# Patient Record
Sex: Male | Born: 1960 | Race: White | Hispanic: No | Marital: Single | Smoking: Never smoker
Health system: Southern US, Community
[De-identification: ages and names within clinical notes are randomized; demographics above are authoritative.]

## PROBLEM LIST (undated history)

## (undated) DIAGNOSIS — R569 Unspecified convulsions: Secondary | ICD-10-CM

## (undated) HISTORY — PX: HERNIA REPAIR: SHX51

---
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
## 2001-11-13 ENCOUNTER — Ambulatory Visit (HOSPITAL_BASED_OUTPATIENT_CLINIC_OR_DEPARTMENT_OTHER): Admission: RE | Admit: 2001-11-13 | Discharge: 2001-11-13 | Payer: Self-pay | Admitting: General Surgery

## 2003-03-05 ENCOUNTER — Encounter: Admission: RE | Admit: 2003-03-05 | Discharge: 2003-03-05 | Payer: Self-pay | Admitting: Neurology

## 2004-01-27 ENCOUNTER — Emergency Department (HOSPITAL_COMMUNITY): Admission: EM | Admit: 2004-01-27 | Discharge: 2004-01-28 | Payer: Self-pay | Admitting: Emergency Medicine

## 2004-02-03 ENCOUNTER — Ambulatory Visit: Payer: Self-pay | Admitting: Family Medicine

## 2005-01-03 ENCOUNTER — Ambulatory Visit: Payer: Self-pay | Admitting: Internal Medicine

## 2005-07-11 IMAGING — CR DG FOOT COMPLETE 3+V*L*
3 series · 3 of 3 positions shown · non-contrast
Comparison: None.

CLINICAL DATA: Fall.  Lateral foot pain.  
 THREE VIEW LEFT FOOT, 01/27/04:

[view not recorded (1 of 3)]
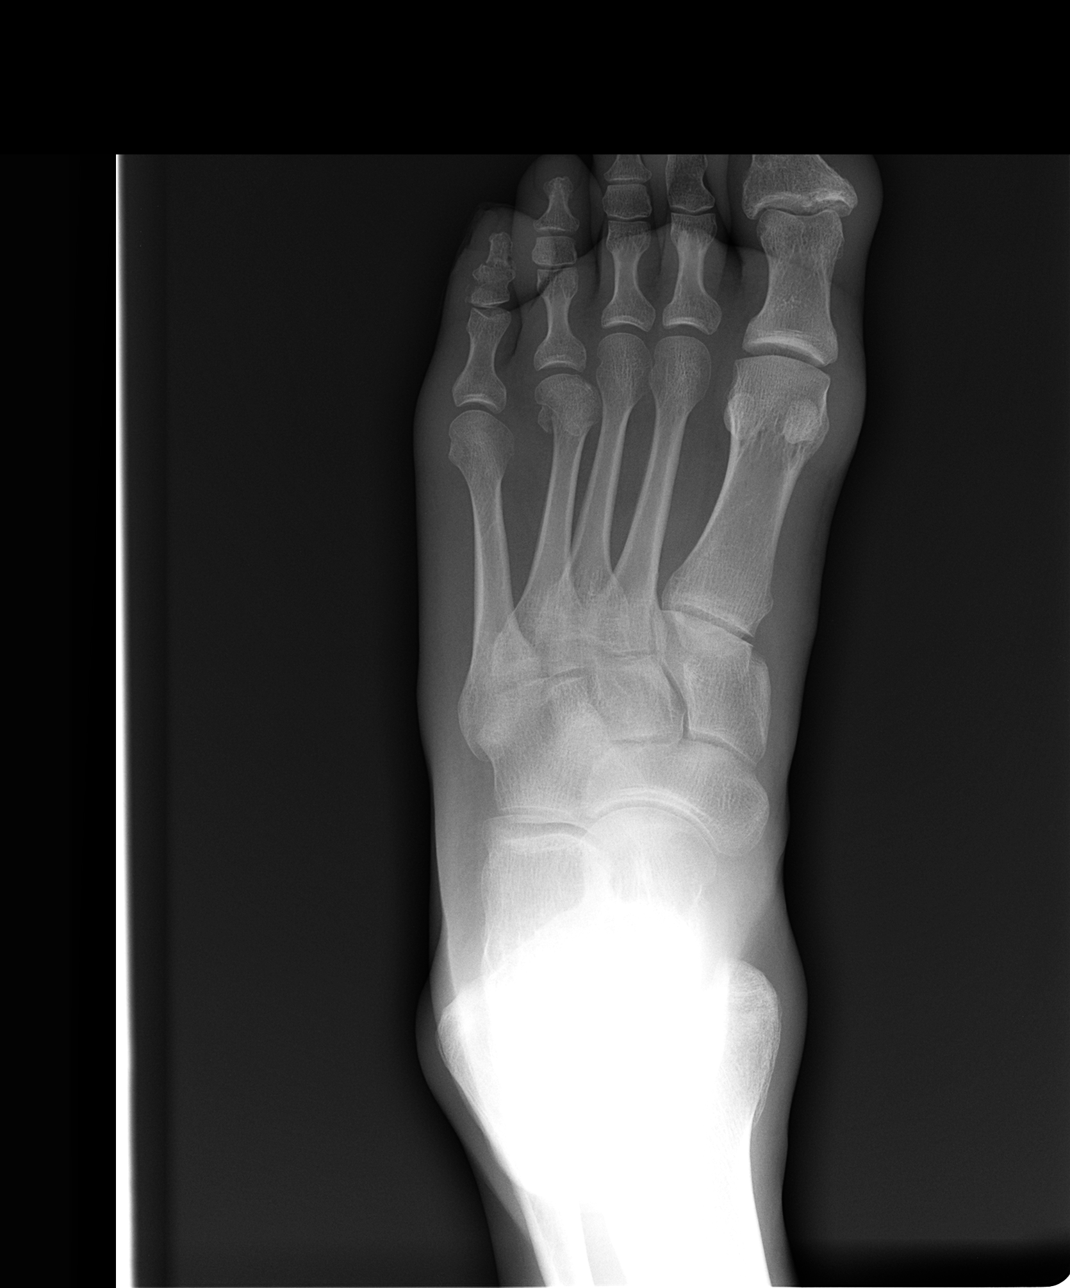

[view not recorded (2 of 3)]
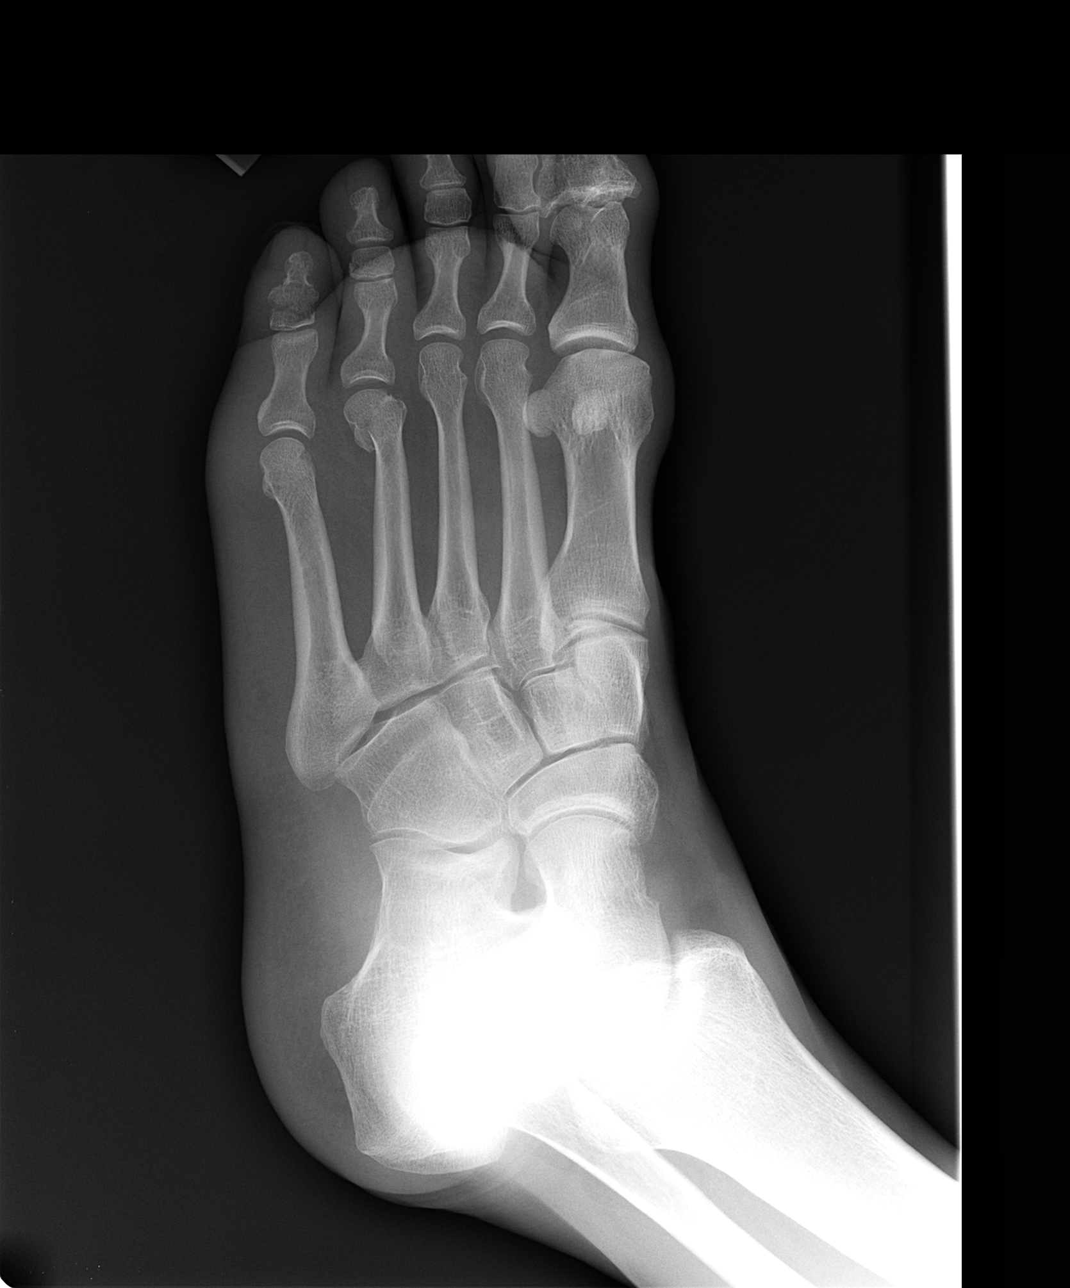

[view not recorded (3 of 3)]
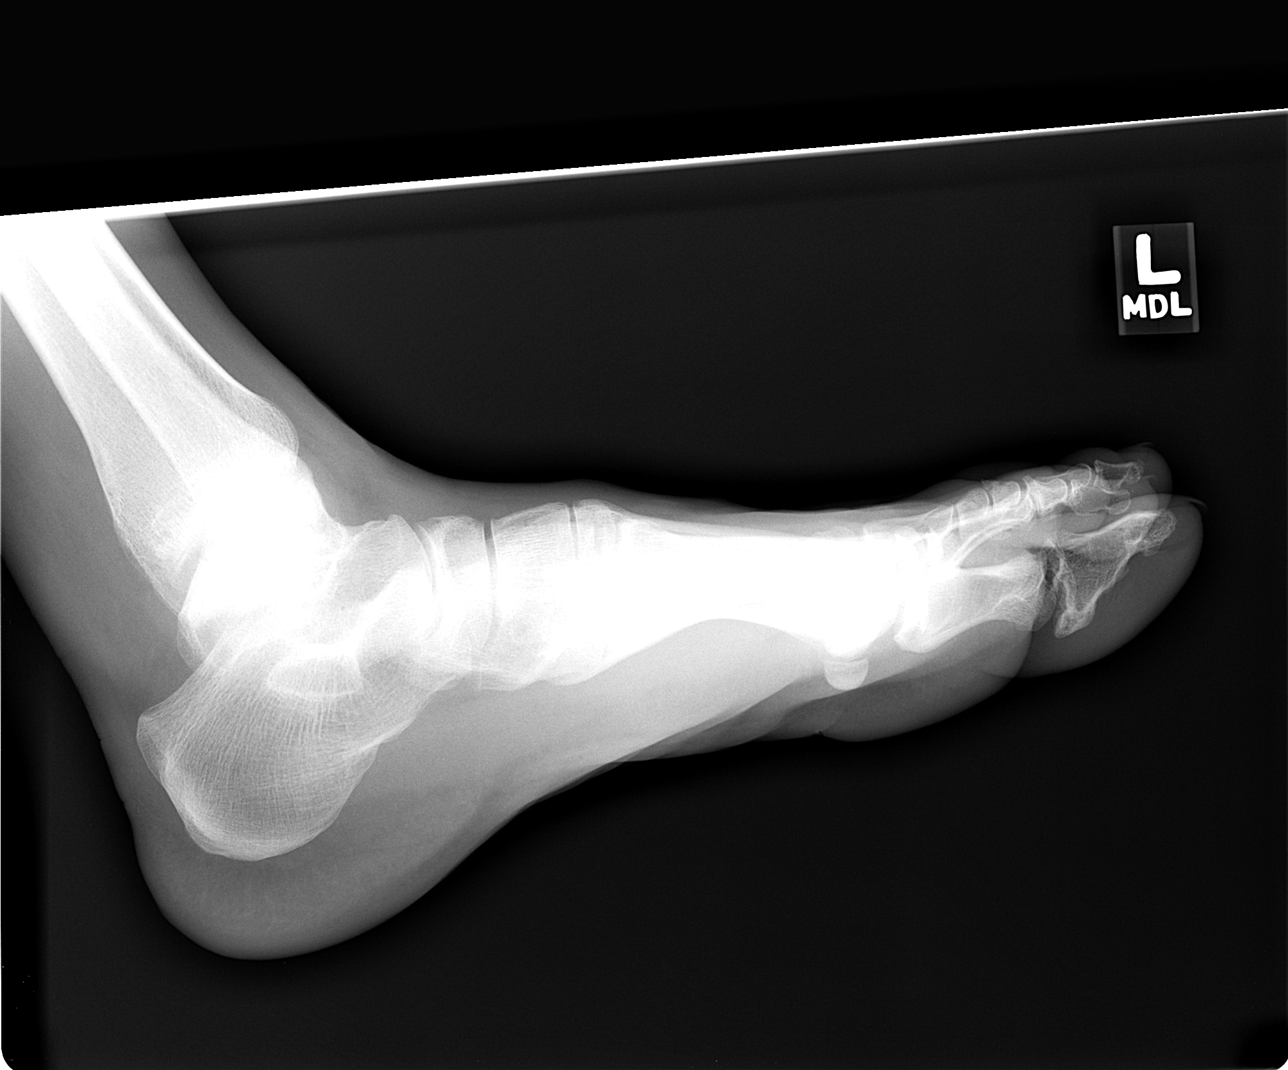

[3 of 3 positions shown; findings below may reference images not displayed]

Three view exam of the left foot shows a comminuted fracture involving the distal 4th metatarsal.  Fracture of the middle and distal phalanx of the little toe noted as well.  There may be an associated fracture of the middle phalanx of the 4th toe.  Degenerative changes are seen at the IP joint of the great toe. 
 Lateral projection does not demonstrate the articulation between the talus and the calcaneus well raising the question of talocalcaneal coalition although there is no evidence for talar beaking to support this finding.
IMPRESSION: Acute fractures of the 4th metatarsal and phalanges of the little toe.  There may also be a small fracture fragment associated with the middle phalanx of the 4th toe.

## 2005-07-11 IMAGING — CT CT HEAD W/O CM
2 of 3 series · 14 of 33 positions shown, 18 images · non-contrast
Comparison: None.

CLINICAL DATA: Fall with head injury.
 HEAD CT WITHOUT CONTRAST, 01/27/04:

[Series 2: — · sagittal · 0.61mm/px · 1 of 3 slices shown (1 of 2)]
[im 2/3  brain]
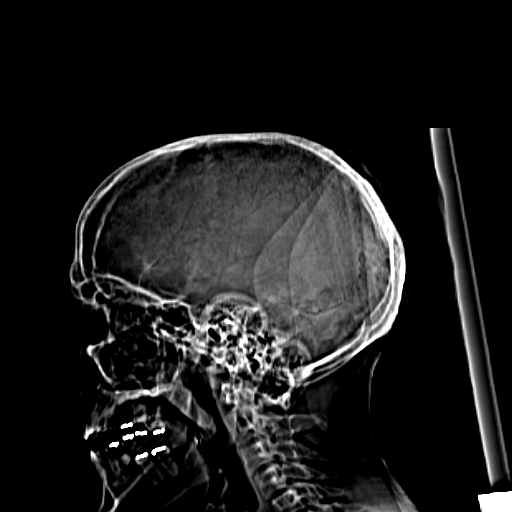

[Series 3: — · axial · 0.43mm/px · z∈[+1239,+1364]mm · 13 of 31 slices shown, 17 images (2 of 2)]
[im 3/31  brain]
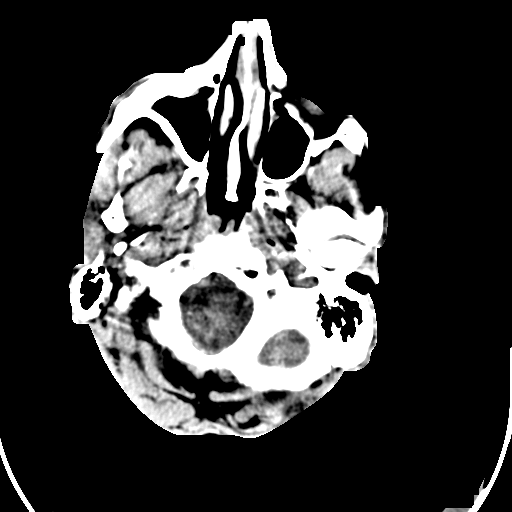
[im 3/31  bone]
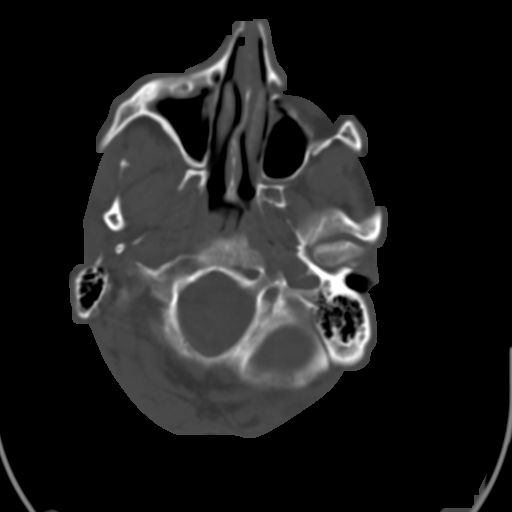
[im 5/31  brain]
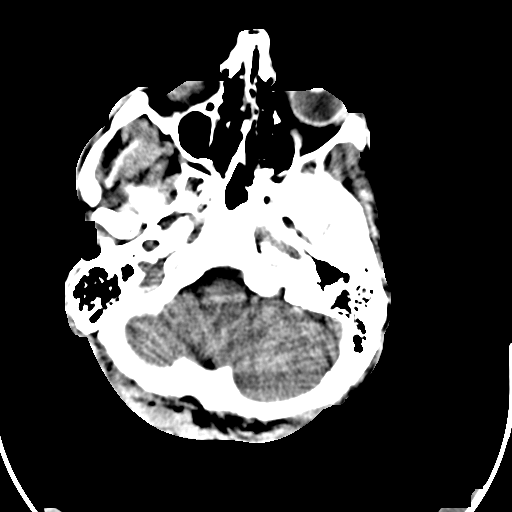
[im 7/31  brain]
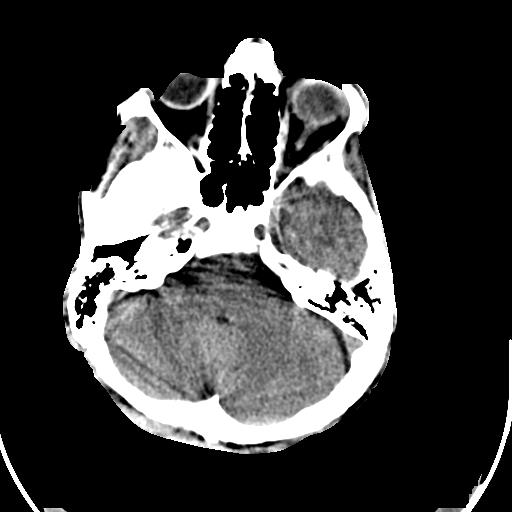
[im 9/31  brain]
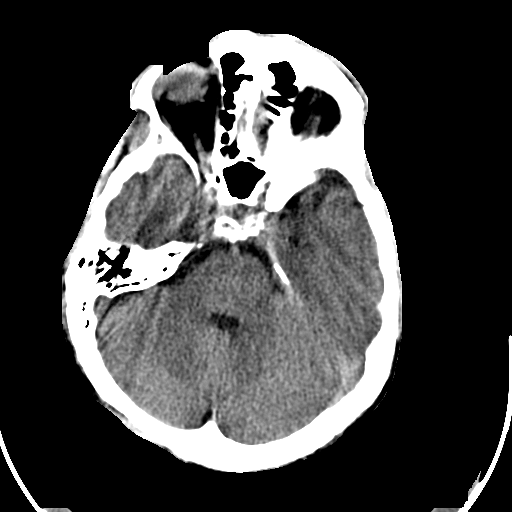
[im 11/31  brain]
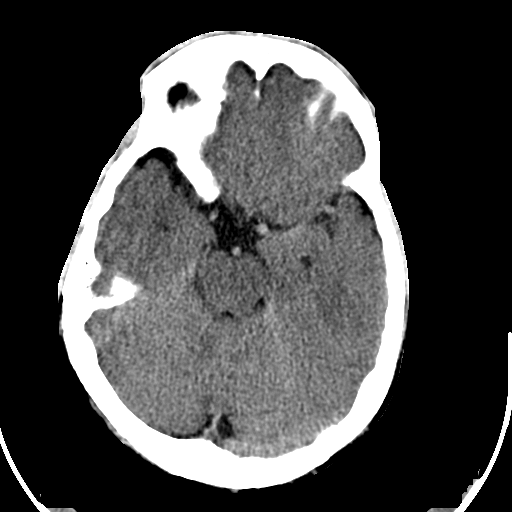
[im 11/31  bone]
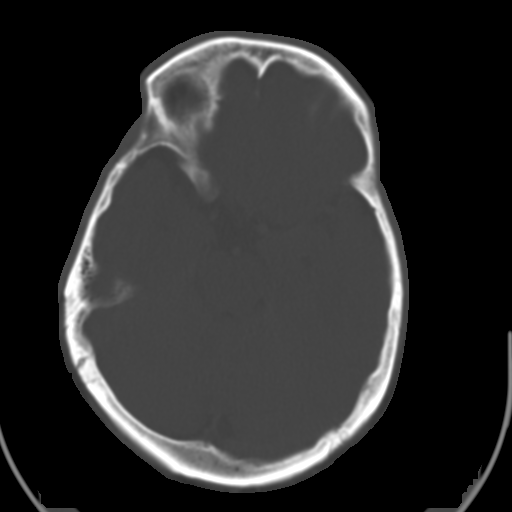
[im 13/31  brain]
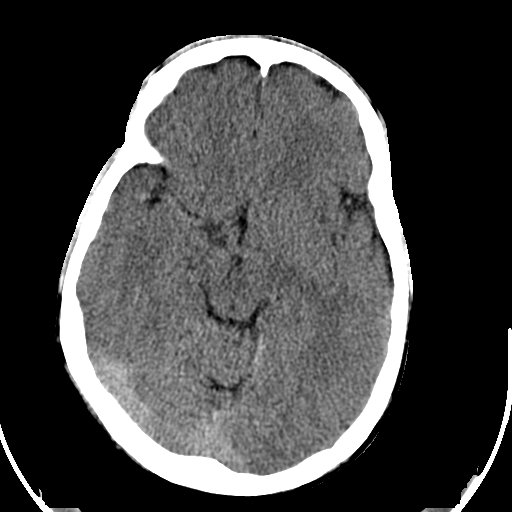
[im 16/31  brain]
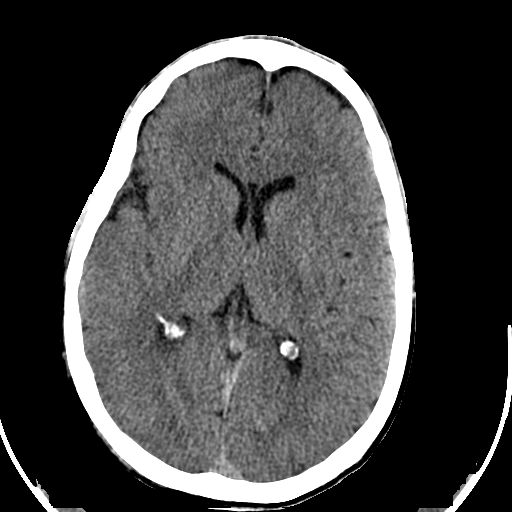
[im 18/31  brain]
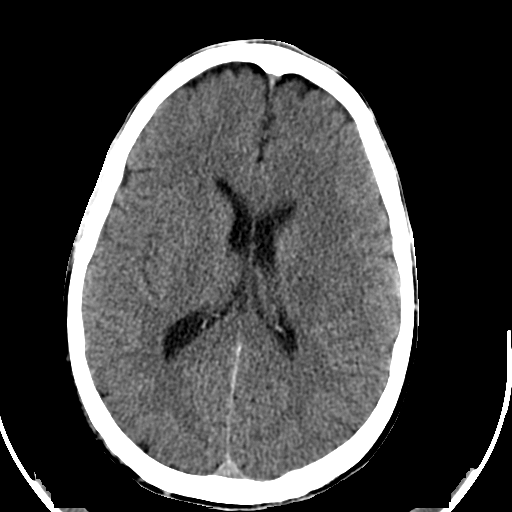
[im 20/31  brain]
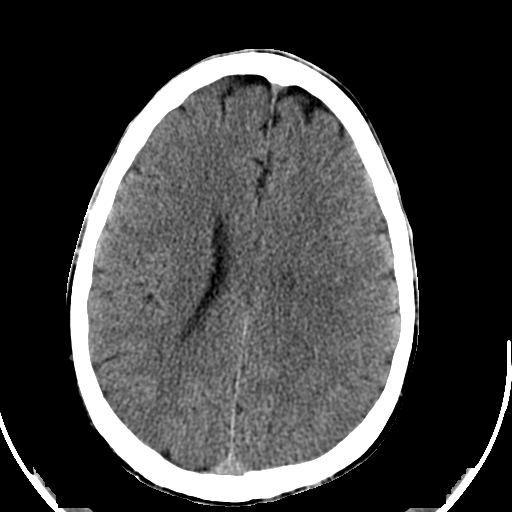
[im 20/31  bone]
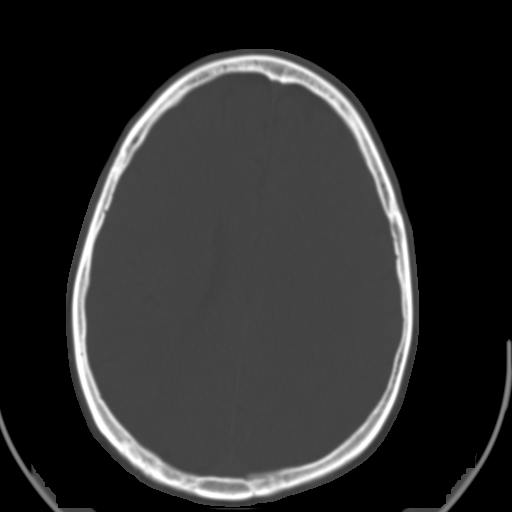
[im 22/31  brain]
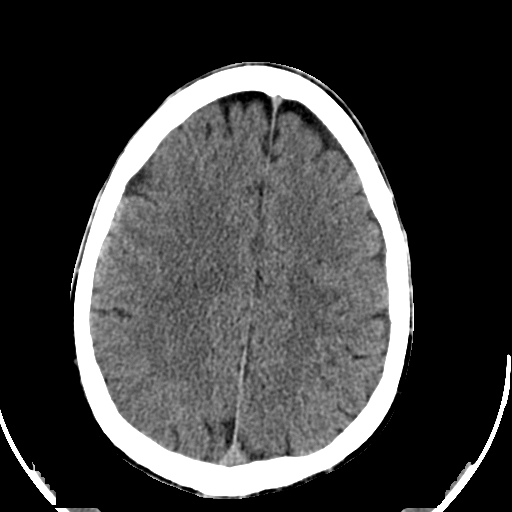
[im 24/31  brain]
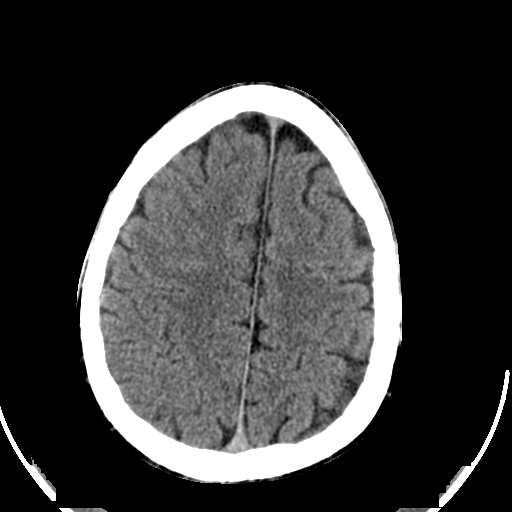
[im 26/31  brain]
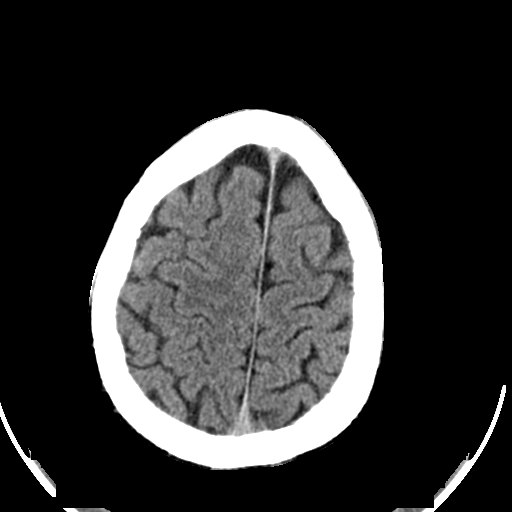
[im 28/31  brain]
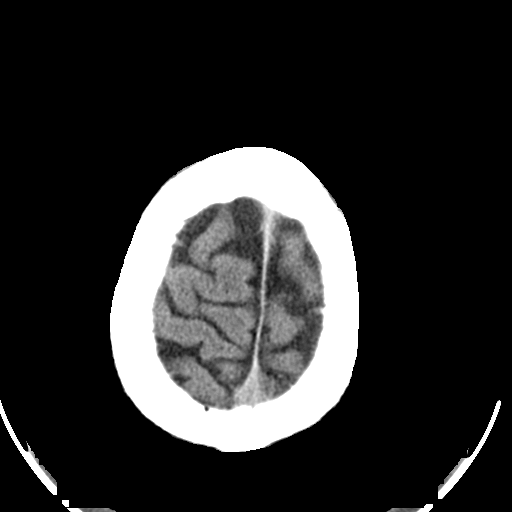
[im 28/31  bone]
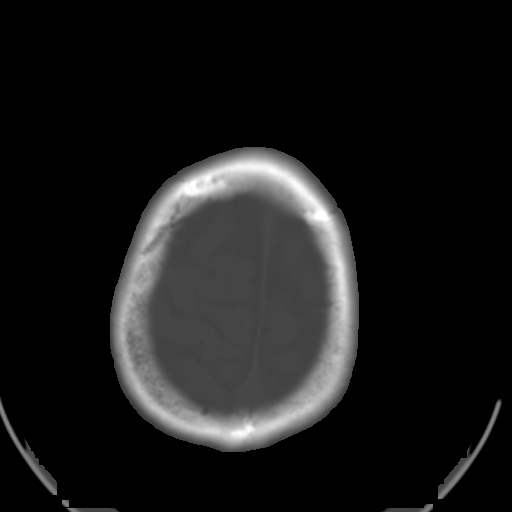

[14 of 33 positions shown; findings below may reference images not displayed]

FINDINGS: There is no evidence for acute hemorrhage, hydrocephalus, mass effect, or abnormal extraaxial fluid collection.  Fracture of the right nasal bone noted.  There is mucosal thickening in the right maxillary sinus with an air-fluid level, which may be related to a component of hemorrhage.  Focal soft tissue thickening over the frontal scalp suggests contusion.
IMPRESSION: 1.  No acute intracranial abnormality.  
 2.  Nasal bone fracture.

## 2009-02-23 ENCOUNTER — Encounter: Payer: Self-pay | Admitting: Internal Medicine

## 2009-03-16 ENCOUNTER — Encounter: Admission: RE | Admit: 2009-03-16 | Discharge: 2009-03-16 | Payer: Self-pay | Admitting: Neurology

## 2010-03-18 ENCOUNTER — Encounter: Payer: Self-pay | Admitting: Internal Medicine

## 2010-04-22 NOTE — Consult Note (Signed)
Summary: [facility]  [facility]   Imported By: Lanelle Bal 03/30/2010 08:14:01  _____________________________________________________________________  External Attachment:    Type:   Image     Comment:   External Document

## 2010-08-06 NOTE — Op Note (Signed)
NAME:  SEMISI, [name]                      ACCOUNT NO.:  [id number]   MEDICAL RECORD NO.:  [id number]                   PATIENT TYPE:  AMB   LOCATION:  [facility]                                  FACILITY:  [facility]   PHYSICIAN:  Anselm Pancoast. Zachery Dakins, M.D.          DATE OF BIRTH:  November 03, 1960   DATE OF PROCEDURE:  11/13/2001  DATE OF DISCHARGE:                                 OPERATIVE REPORT   PREOPERATIVE DIAGNOSIS:  Left inguinal hernia, probably indirect.   POSTOPERATIVE DIAGNOSIS:  Indirect left inguinal hernia.   PROCEDURE:  Left inguinal herniorrhaphy with mesh reinforcement.   ANESTHESIA:  MAC with local.   SURGEON:  Anselm Pancoast. Zachery Dakins, M.D.   ASSISTANT:  Nurse.   HISTORY:  The patient is a 50 year old Caucasian male who I first saw  approximately four weeks ago when he had developed pain after return home  from work one day and stated he had had a pulling sensation while he was  moving some heavy objects at work.  This was reported to his supervisor and  he was referred for Workers Comp evaluation and when I first saw him at the  office, he is a thin, lanky young male who had had a previous herniorrhaphy  when he was a child on the right, and I could not feel a definite bulge on  physical exam.  I thought from the history it sounds as if he did have a  fresh left inguinal hernia, and I recommended that I re-examine it  approximately two weeks later and when I did, I still could not feel a  definite bulge but his mother stated that she had actually seen the bulge  and he certainly gave a history of intermittent bulge and pain in the left  groin, pain that radiated to his testicle.  Since then it has increased in  size, so it is obviously an indirect hernia that reduces spontaneously, and  has been approved by Workers Comp.  I had recommended that we repair it as  an outpatient with mesh reinforcement, and he is here for the planned  procedure today.   DESCRIPTION OF  PROCEDURE:  The patient was given 1 g of Kefzol, sedation  administered, and then the left pubic area was clipped and then prepped with  Betadine surgical solution and draped in a sterile manner.  The inguinal  incision area was infiltrated with a mixture of 0.5% plain Xylocaine and  0.25% Marcaine with adrenalin, and the ilioinguinal nerve areas infiltrated  with a blunted 21-gauge needle, all total about 25 cc of solution used.  Sharp dissection through the skin, subcutaneous, superficial veins x2  clamped, divided, and ligated with fine Vicryl, and then the external  oblique was opened.  Then the external oblique was opened through the  external ring.  The ilioinguinal nerve was protected, elevated with the cord  structures, and you could see an obvious indirect hernia sac protruding  through the internal ring.  This was separated from the cord structures.  The sac was opened, and it was fairly well-formed, and a high sac ligation  was performed using a 2-0 silk suture.  There was a little area of the  attachment of the appendices of the sigmoid colon that was within the hernia  sac, and this was separated and ligated with fine Vicryl and placed back in  the preperitoneal cavity prior to the sac ligation under direct vision.  It  retracted nicely, and then the internal ring was tightened up with a few  interrupted sutures of 2-0 silk.  Next a piece of Prolene mesh shaped like a  sail, slit laterally, was placed, reinforcing the floor starting at the  symphysis pubis and sutured to the shelving edge of the inguinal ligament  inferiorly with a running 2-0 Prolene and the two tails sutured together  laterally, recreating and reinforcing the internal ring.  The superior flap  was sutured with interrupted 2-0 Prolene, and it was lying flat under  minimal tension.  The external oblique was then closed with a 3-0 Vicryl,  Scarpa's fascia closed with interrupted sutures of 5-0 Vicryl, and then a  5-  0 Vicryl subcuticular and Benzoin and steri-strips on the skin.  The patient  was awake at completion of surgery, minimal discomfort, and will be released  after a short stay.  I will see him in the office in approximately a week.  He should be able to return to work in probably about three weeks according  to the nature of his work and the amount of pain postoperatively.                                               Anselm Pancoast. Zachery Dakins, M.D.    WJW/MEDQ  D:  11/13/2001  T:  11/16/2001  Job:  [id number]   cc:   Dr. Vivia Ewing

## 2011-03-18 ENCOUNTER — Other Ambulatory Visit: Payer: Self-pay | Admitting: Neurology

## 2011-03-18 DIAGNOSIS — R569 Unspecified convulsions: Secondary | ICD-10-CM

## 2011-04-26 ENCOUNTER — Ambulatory Visit
Admission: RE | Admit: 2011-04-26 | Discharge: 2011-04-26 | Disposition: A | Discharge status: Home / Self Care | Payer: Self-pay | Source: Ambulatory Visit | Attending: Neurology | Admitting: Neurology

## 2011-04-26 DIAGNOSIS — R569 Unspecified convulsions: Secondary | ICD-10-CM

## 2012-11-21 ENCOUNTER — Telehealth: Payer: Self-pay | Admitting: Neurology

## 2012-11-22 NOTE — Telephone Encounter (Signed)
Reassigned to Dr. Marjory Lies.  Sent to scheduling.

## 2013-02-13 ENCOUNTER — Telehealth: Payer: Self-pay | Admitting: Diagnostic Neuroimaging

## 2013-02-13 MED ORDER — CARBAMAZEPINE 200 MG PO TABS: 400.0000 mg | ORAL_TABLET | Freq: Two times a day (BID) | ORAL | Status: DC

## 2013-02-13 NOTE — Telephone Encounter (Signed)
Former [facility] patient assigned to Dr Marjory Lies.  Rx has been sent.

## 2013-02-18 ENCOUNTER — Telehealth: Payer: Self-pay | Admitting: Diagnostic Neuroimaging

## 2013-02-18 NOTE — Telephone Encounter (Signed)
Rx was already sent to mail order.

## 2013-02-19 ENCOUNTER — Telehealth: Payer: Self-pay | Admitting: *Deleted

## 2013-02-19 MED ORDER — CARBAMAZEPINE 200 MG PO TABS: 400.0000 mg | ORAL_TABLET | Freq: Two times a day (BID) | ORAL | Status: DC

## 2013-05-11 ENCOUNTER — Encounter (HOSPITAL_COMMUNITY): Payer: Self-pay | Admitting: Emergency Medicine

## 2013-05-11 ENCOUNTER — Emergency Department (INDEPENDENT_AMBULATORY_CARE_PROVIDER_SITE_OTHER): Payer: BC Managed Care – PPO

## 2013-05-11 ENCOUNTER — Emergency Department (HOSPITAL_COMMUNITY)
Admission: EM | Admit: 2013-05-11 | Discharge: 2013-05-11 | Disposition: A | Discharge status: Home / Self Care | Payer: BC Managed Care – PPO | Source: Home / Self Care | Attending: Emergency Medicine | Admitting: Emergency Medicine

## 2013-05-11 DIAGNOSIS — S40019A Contusion of unspecified shoulder, initial encounter: Secondary | ICD-10-CM

## 2013-05-11 HISTORY — DX: Unspecified convulsions: R56.9

## 2013-05-11 MED ORDER — HYDROCODONE-ACETAMINOPHEN 5-325 MG PO TABS
1.0000 | ORAL_TABLET | Freq: Once | ORAL | Status: AC
  Administered 2013-05-11: 1 via ORAL

## 2013-05-11 MED ORDER — IBUPROFEN 800 MG PO TABS
800.0000 mg | ORAL_TABLET | Freq: Once | ORAL | Status: AC
  Administered 2013-05-11: 800 mg via ORAL

## 2013-05-11 MED ORDER — HYDROCODONE-ACETAMINOPHEN 5-325 MG PO TABS
ORAL_TABLET | ORAL | Status: AC
  Filled 2013-05-11: qty 1

## 2013-05-11 MED ORDER — HYDROCODONE-ACETAMINOPHEN 5-325 MG PO TABS: 1.0000 | ORAL_TABLET | ORAL | Status: DC | PRN

## 2013-05-11 MED ORDER — IBUPROFEN 800 MG PO TABS
ORAL_TABLET | ORAL | Status: AC
  Filled 2013-05-11: qty 1

## 2013-05-11 NOTE — Discharge Instructions (Signed)
Please review instructions below. Your xray today shows no broken bones. The likely source of your pain is a contusion or bruise.  Wear the sling over the next 2 to 3 days. Take the arm out of the sling several times a day and move the arm in all directions as you are able. Avoid heavy lifting or other strenuous activity for 2 to 3 days. Take Ibuprofen 800 mg 3 times a day for the next 4 to 5 days. Use the medication for pain as directed. If the pain in your shoulder persist or gets worse please arrange follow up with Dr Magnus IvanBlackman for further evaluation.   Contusion A contusion is a deep bruise. Contusions happen when an injury causes bleeding under the skin. Signs of bruising include pain, puffiness (swelling), and discolored skin. The contusion may turn blue, purple, or yellow. HOME CARE   Put ice on the injured area.  Put ice in a plastic bag.  Place a towel between your skin and the bag.  Leave the ice on for 15-20 minutes, 03-04 times a day.  Only take medicine as told by your doctor.  Rest the injured area.  If possible, raise (elevate) the injured area to lessen puffiness. GET HELP RIGHT AWAY IF:   You have more bruising or puffiness.  You have pain that is getting worse.  Your puffiness or pain is not helped by medicine. MAKE SURE YOU:   Understand these instructions.  Will watch your condition.  Will get help right away if you are not doing well or get worse. Document Released: 08/24/2007 Document Revised: 05/30/2011 Document Reviewed: 01/10/2011 [facility] Patient Information 2014 [employer] [place].

## 2013-05-11 NOTE — ED Notes (Signed)
C/o left shoulder pain States he did fall on a sheet of ice this evening  Did ice shoulder as treatment

## 2013-05-11 NOTE — ED Provider Notes (Signed)
CSN: [id number]     Arrival date & time 05/11/13  1614 History   First MD Initiated Contact with Patient 05/11/13 1728     Chief Complaint  Patient presents with  . Shoulder Pain     Patient is a 53 y.o. male presenting with shoulder pain. The history is provided by a parent.  Shoulder Pain This is a new problem. The current episode started 3 to 5 hours ago. The problem occurs constantly. The problem has not changed since onset.Pertinent negatives include no chest pain, no abdominal pain, no headaches and no shortness of breath. The symptoms are aggravated by bending. The symptoms are relieved by acetaminophen.  Pt is mentally challenged and presents with parents. Mother reports pt was walking around outside today at approx 1330 when he slipped on ice and fell onto his (L) shoulder. He has c/o pain to the shoulder since and has continued to guard the shoulder to avoid certain movements. Was given Tylenol for pain and pt states it helped "a little".  Past Medical History  Diagnosis Date  . Seizures    Past Surgical History  Procedure Laterality Date  . Hernia repair     No family history on file. History  Substance Use Topics  . Smoking status: Not on file  . Smokeless tobacco: Not on file  . Alcohol Use: Not on file    Review of Systems  Respiratory: Negative for shortness of breath.   Cardiovascular: Negative for chest pain.  Gastrointestinal: Negative for abdominal pain.  Neurological: Negative for headaches.  All other systems reviewed and are negative.      Allergies  Review of patient's allergies indicates no known allergies.  Home Medications   Current Outpatient Rx  Name  Route  Sig  Dispense  Refill  . carbamazepine (TEGRETOL) 200 MG tablet   Oral   Take 2 tablets (400 mg total) by mouth 2 (two) times daily.   360 tablet   1    BP 119/77  Pulse 80  Temp(Src) 99.7 F (37.6 C) (Oral)  Resp 18  SpO2 97% Physical Exam  Constitutional: He appears  well-developed and well-nourished.  HENT:  Head: Normocephalic and atraumatic.  Eyes: Conjunctivae are normal.  Neck: Neck supple.  Cardiovascular: Normal rate.   Pulmonary/Chest: Effort normal.  Musculoskeletal:  Some limited ROM particularly when doing lateral arm raises. TTP to anterior (L) shoulder w/o obvious deformity or open wound. Some mild swelling noted (L) distal clavicular space/anterior shoulder.   Neurological: He is alert.  Skin: Skin is warm and dry.  Psychiatric: He has a normal mood and affect.    ED Course  Procedures (including critical care time) Labs Review Labs Reviewed - No data to display Imaging Review No results found.    MDM   Final diagnoses:  None   Fall on ice today onto (L) shoulder. Xray neg for fracture/dislocation. Exam c/w (L) shoulder contusion. Placed in sling for comfort. To perform active ROM w/ LUE several times a day. Ibuprofen TID for 4-5 days and a short course of Vicodin for more severe pain. Ortho referral provided for f/u if needed for worsening or persistent (L) shoulder pain. Discussed plan w/ pt and parents who all verbalize understanding.     Leanne ChangKatherine P Schorr, NP 05/11/13 1843

## 2013-05-12 NOTE — ED Provider Notes (Signed)
Medical screening examination/treatment/procedure(s) were performed by non-physician practitioner and as supervising physician I was immediately available for consultation/collaboration.  Leslee Homeavid Kenaz Olafson, M.D.  Reuben Likesavid C Victoriano Campion, MD 05/12/13 1255

## 2013-05-29 ENCOUNTER — Ambulatory Visit (INDEPENDENT_AMBULATORY_CARE_PROVIDER_SITE_OTHER): Payer: BC Managed Care – PPO | Admitting: Diagnostic Neuroimaging

## 2013-05-29 ENCOUNTER — Encounter: Payer: Self-pay | Admitting: Diagnostic Neuroimaging

## 2013-05-29 VITALS — BP 102/65 | HR 76 | Ht 70.0 in | Wt 160.0 lb

## 2013-05-29 DIAGNOSIS — G40109 Localization-related (focal) (partial) symptomatic epilepsy and epileptic syndromes with simple partial seizures, not intractable, without status epilepticus: Secondary | ICD-10-CM

## 2013-05-29 DIAGNOSIS — R6250 Unspecified lack of expected normal physiological development in childhood: Secondary | ICD-10-CM

## 2013-05-29 DIAGNOSIS — F70 Mild intellectual disabilities: Secondary | ICD-10-CM | POA: Insufficient documentation

## 2013-05-29 MED ORDER — VITAMIN D3 25 MCG (1000 UT) PO CAPS: 1200.0000 [IU] | ORAL_CAPSULE | Freq: Every day | ORAL | Status: AC

## 2013-05-29 MED ORDER — MULTIPLE VITAMIN PO TABS: 1.0000 | ORAL_TABLET | Freq: Every day | ORAL | Status: DC

## 2013-05-29 NOTE — Patient Instructions (Signed)
Continue current medications. 

## 2013-05-29 NOTE — Progress Notes (Signed)
[facility]  PATIENT: [name] DOB: January 18, 1961  REFERRING CLINICIAN:  HISTORY FROM: patient  REASON FOR VISIT: follow up   HISTORICAL  CHIEF COMPLAINT:  Chief Complaint  Patient presents with  . Follow-up    sz    HISTORY OF PRESENT ILLNESS:   UPDATE 05/29/13: 53 year old male here for followup seizure disorder. Patient is doing well on carbamazepine 400 mg twice a day. He takes vitamin D, calcium, multivitamin daily. No seizure since 1989.  PRIOR HPI (03/12/12, Dr. Sandria Manly): 53 year old left-handed white single male who lives with his parents and has a history of mental retardation,complex partial, and secondarily generalized seizures followed on a yearly basis. He has not had any seizures since 1989.  He was first seen by Dr. Ninetta Lights in January,1977 at age 75. He would feel sick and "couldn't talk". This began  in 1976. When he was in school, teachers would notice that he would drop his penciils and paper and make clicking sounds with his mouth. The episodes would last a few seconds. The patient had been on Klonopin for  hyperactivity" and was switched to phenobarb.  In June 1977 he had 2 days of 3 or 4 seizures. 12/06/1975 he was [facility] to [facility] with a  generalized major motor seizure. 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 Lonell Face  and hyperactivity. His developmental  motor milestones were delayed, walking at 14 months,  toilet trained at 6 years, and did not ride a bicycle until 9 or 10 years.He did not pass the third grade and received unsatisfactory grades in grammar school. EEG 9/19/ 77 was normal. He was treated with Cylert for hyperactivity and phenobarbital 80 mg per day.He began having seizures about once every 3 months. These were complex partial seizures. He began Tegretol in 1984 after Depakote, Phenobarbital, and Dilantin were not successful and has had good results with that medication. He denies  macropsia, micropsia, dj vu, strange odors or tastes. Last bone density study 04/26/2011 did not show evidence of osteopenia in the left hip but a decrease in bone density in the lumbar spine. Last CBC, CMP, and carbamazepine level 03/21/2011 were normal with a  trough carbamazepine level of 4.6 micrograms per deciliter. A CT scan of the brain with and without contrast enhancement in 1977 was normal. He  is independent in his activities of daily living. He works loading and unloading trucks.His parents have decided that they did not wish him  to drive a car. He has no side effects of medication such as diarrhea,   dizziness, double vision, or drowsiness. Does complain of numbness in his right hand intermittently without neck pain. This is relieved by shaking his right hand. He has bilateral shoulder pain and possible rotator cuff disease. The  pain is relieved by not working.  He is independent in activities of daily living. He does not drive a car or handle his financial needs. He exercises at  the gym one or 2 times per week.   REVIEW OF SYSTEMS: Full 14 system review of systems performed and notable only for seizure disorder. Fell on ice recently.  ALLERGIES: No Known Allergies  HOME MEDICATIONS: Outpatient Prescriptions Prior to Visit  Medication Sig Dispense Refill  . carbamazepine (TEGRETOL) 200 MG tablet Take 2 tablets (400 mg total) by mouth 2 (two) times daily.  360 tablet  1  . HYDROcodone-acetaminophen (NORCO/VICODIN) 5-325 MG per tablet Take 1 tablet by  mouth every 4 (four) hours as needed.  15 tablet  0   No facility-administered medications prior to visit.    PAST MEDICAL HISTORY: Past Medical History  Diagnosis Date  . Seizures     PAST SURGICAL HISTORY: Past Surgical History  Procedure Laterality Date  . Hernia repair  age 89 and [age]    x2,  . Eye surgery Right 1967    FAMILY HISTORY: History reviewed. No pertinent family history.  SOCIAL HISTORY:  History   Social  History  . Marital Status: Single    Spouse Name: N/A    Number of Children: 0  . Years of Education: HS   Occupational History  .      [employer]   Social History Main Topics  . Smoking status: Never Smoker   . Smokeless tobacco: Never Used  . Alcohol Use: No  . Drug Use: No  . Sexual Activity: Not on file   Other Topics Concern  . Not on file   Social History Narrative   Patient lives at home with his parents.   Caffeine Use: Drinks tea, coffee, and soda daily.      PHYSICAL EXAM  Filed Vitals:   05/29/13 1441  BP: 102/65  Pulse: 76  Height: 5\' 10"  (1.778 m)  Weight: 160 lb (72.576 kg)    Not recorded    Body mass index is 22.96 kg/(m^2).  GENERAL EXAM: Patient is in no distress; well developed, nourished and groomed; neck is supple  CARDIOVASCULAR: Regular rate and rhythm, no murmurs, no carotid bruits  NEUROLOGIC: MENTAL STATUS: awake, alert, language fluent, comprehension intact, naming intact, fund of knowledge appropriate CRANIAL NERVE: no papilledema on fundoscopic exam, pupils equal and reactive to light, visual fields full to confrontation, extraocular muscles intact, no nystagmus, facial sensation and strength symmetric, hearing intact, palate elevates symmetrically, uvula midline, shoulder shrug symmetric, tongue midline. MOTOR: MILD POSTURAL TREMOR. Normal bulk and tone, full strength in the BUE, BLE SENSORY: normal and symmetric to light touch, pinprick, temperature, vibration and proprioception COORDINATION: finger-nose-finger, fine finger movements normal REFLEXES: deep tendon reflexes present and symmetric GAIT/STATION: narrow based gait; able to walk tandem; romberg is negative    DIAGNOSTIC DATA (LABS, IMAGING, TESTING) - I reviewed patient records, labs, notes, testing and imaging myself where available.  No results found for this basename: WBC, HGB, HCT, MCV, PLT   No results found for this basename: na, k, cl, co2, glucose, bun,  creatinine, calcium, prot, albumin, ast, alt, alkphos, bilitot, gfrnonaa, gfraa   No results found for this basename: CHOL, HDL, LDLCALC, LDLDIRECT, TRIG, CHOLHDL   No results found for this basename: HGBA1C   No results found for this basename: VITAMINB12   No results found for this basename: TSH     ASSESSMENT AND PLAN  53 y.o. left-handed white single male who lives with his parents and has a history of mental retardation, complex partial, and secondarily generalized seizures followed on a yearly basis. Doing well.  PLAN: Orders Placed This Encounter  Procedures  . CBC With differential/Platelet  . Comprehensive metabolic panel   Return in about 1 year (around 05/30/2014).    Suanne MarkerVIKRAM R. Janille Draughon, MD 05/29/2013, 3:23 PM Certified in Neurology, Neurophysiology and Neuroimaging  [facility] [address], [address] [place], [place] [address] [phone]

## 2013-05-30 ENCOUNTER — Telehealth: Payer: Self-pay | Admitting: Diagnostic Neuroimaging

## 2013-05-30 LAB — COMPREHENSIVE METABOLIC PANEL
ALT: 12 IU/L (ref 0–44)
AST: 17 IU/L (ref 0–40)
Albumin/Globulin Ratio: 2 (ref 1.1–2.5)
Albumin: 4.5 g/dL (ref 3.5–5.5)
Alkaline Phosphatase: 105 IU/L (ref 39–117)
BUN/Creatinine Ratio: 19 (ref 9–20)
BUN: 15 mg/dL (ref 6–24)
CO2: 26 mmol/L (ref 18–29)
Calcium: 9.2 mg/dL (ref 8.7–10.2)
Chloride: 100 mmol/L (ref 97–108)
Creatinine, Ser: 0.77 mg/dL (ref 0.76–1.27)
GFR calc Af Amer: 122 mL/min/{1.73_m2} (ref >59)
GFR calc non Af Amer: 106 mL/min/{1.73_m2} (ref >59)
Globulin, Total: 2.2 g/dL (ref 1.5–4.5)
Glucose: 92 mg/dL (ref 65–99)
Potassium: 4.5 mmol/L (ref 3.5–5.2)
Sodium: 142 mmol/L (ref 134–144)
Total Bilirubin: <0.2 mg/dL (ref 0.0–1.2)
Total Protein: 6.7 g/dL (ref 6.0–8.5)

## 2013-05-30 LAB — CBC WITH DIFFERENTIAL
Basophils Absolute: 0 10*3/uL (ref 0.0–0.2)
Basos: 0 %
Eos: 4 %
Eosinophils Absolute: 0.3 10*3/uL (ref 0.0–0.4)
HCT: 42.6 % (ref 37.5–51.0)
Hemoglobin: 14.2 g/dL (ref 12.6–17.7)
Immature Grans (Abs): 0 10*3/uL (ref 0.0–0.1)
Immature Granulocytes: 0 %
Lymphocytes Absolute: 1.6 10*3/uL (ref 0.7–3.1)
Lymphs: 23 %
MCH: 30.2 pg (ref 26.6–33.0)
MCHC: 33.3 g/dL (ref 31.5–35.7)
MCV: 91 fL (ref 79–97)
Monocytes Absolute: 0.8 10*3/uL (ref 0.1–0.9)
Monocytes: 11 %
Neutrophils Absolute: 4.1 10*3/uL (ref 1.4–7.0)
Neutrophils Relative %: 62 %
Platelets: 242 10*3/uL (ref 150–379)
RBC: 4.7 x10E6/uL (ref 4.14–5.80)
RDW: 14.1 % (ref 12.3–15.4)
WBC: 6.7 10*3/uL (ref 3.4–10.8)

## 2013-05-30 MED ORDER — CARBAMAZEPINE 200 MG PO TABS
400.0000 mg | ORAL_TABLET | Freq: Two times a day (BID) | ORAL | Status: DC
Start: 2013-05-30 — End: 2014-06-16

## 2013-05-30 NOTE — Telephone Encounter (Signed)
Rx has been sent  

## 2013-05-30 NOTE — Telephone Encounter (Signed)
Pt's mother called. She stated she forgot to mention when she was here yesterday that [name] needs to have his Tegretol refilled.  [facility] [address].  Please contact if necessary.  Thank you

## 2014-05-30 ENCOUNTER — Ambulatory Visit: Payer: BC Managed Care – PPO | Admitting: Diagnostic Neuroimaging

## 2014-06-11 ENCOUNTER — Ambulatory Visit: Payer: Self-pay | Admitting: Diagnostic Neuroimaging

## 2014-06-16 ENCOUNTER — Ambulatory Visit (INDEPENDENT_AMBULATORY_CARE_PROVIDER_SITE_OTHER): Payer: BLUE CROSS/BLUE SHIELD | Admitting: Diagnostic Neuroimaging

## 2014-06-16 ENCOUNTER — Encounter: Payer: Self-pay | Admitting: Diagnostic Neuroimaging

## 2014-06-16 VITALS — BP 98/58 | HR 65 | Ht 70.0 in | Wt 160.2 lb

## 2014-06-16 DIAGNOSIS — G40109 Localization-related (focal) (partial) symptomatic epilepsy and epileptic syndromes with simple partial seizures, not intractable, without status epilepticus: Secondary | ICD-10-CM | POA: Diagnosis not present

## 2014-06-16 DIAGNOSIS — R6250 Unspecified lack of expected normal physiological development in childhood: Secondary | ICD-10-CM

## 2014-06-16 DIAGNOSIS — F70 Mild intellectual disabilities: Secondary | ICD-10-CM

## 2014-06-16 MED ORDER — CARBAMAZEPINE 200 MG PO TABS: 400.0000 mg | ORAL_TABLET | Freq: Two times a day (BID) | ORAL | Status: DC

## 2014-06-16 NOTE — Patient Instructions (Signed)
Continue current medications. 

## 2014-06-16 NOTE — Progress Notes (Signed)
[facility]  PATIENT: Cameron RavelingKenneth B Richardson DOB: September 29, 1960  REFERRING CLINICIAN:  HISTORY FROM: patient  REASON FOR VISIT: follow up   HISTORICAL  CHIEF COMPLAINT:  Chief Complaint  Patient presents with  . Follow-up    localization-related epilepsy    HISTORY OF PRESENT ILLNESS:   UPDATE 06/16/14 ([facility]): Since last visit, doing well. No seizures. Doing well on CBZ 400mg  BID.  UPDATE 05/29/13: 54 year old male here for followup seizure disorder. Patient is doing well on carbamazepine 400 mg twice a day. He takes vitamin D, calcium, multivitamin daily. No seizure since 1989.  PRIOR HPI (03/12/12, [name]): 54 year old left-handed white single male who lives with his parents and has a history of mental retardation,complex partial, and secondarily generalized seizures followed on a yearly basis. He has not had any seizures since 1989.  He was first seen by Dr. [name] in January,1977 at age 54. He would feel sick and "couldn't talk". This began  in 1976. When he was in school, teachers would notice that he would drop his penciils and paper and make clicking sounds with his mouth. The episodes would last a few seconds. The patient had been on Klonopin for  hyperactivity" and was switched to phenobarb.  In June 1977 he had 2 days of 3 or 4 seizures. 12/06/1975 he was [facility] with a  generalized major motor seizure. 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 [name]  and hyperactivity. His developmental  motor milestones were delayed, walking at 14 months,  toilet trained at 6 years, and did not ride a bicycle until 9 or 10 years.He did not pass the third grade and received unsatisfactory grades in grammar school. EEG 9/19/ 77 was normal. He was treated with Cylert for hyperactivity and phenobarbital 80 mg per day.He began having seizures about once every 3 months. These were complex partial seizures. He began Tegretol in 1984  after Depakote, Phenobarbital, and Dilantin were not successful and has had good results with that medication. He denies macropsia, micropsia, dj vu, strange odors or tastes. Last bone density study 04/26/2011 did not show evidence of osteopenia in the left hip but a decrease in bone density in the lumbar spine. Last CBC, CMP, and carbamazepine level 03/21/2011 were normal with a  trough carbamazepine level of 4.6 micrograms per deciliter. A CT scan of the brain with and without contrast enhancement in 1977 was normal. He  is independent in his activities of daily living. He works loading and unloading trucks.His parents have decided that they did not wish him  to drive a car. He has no side effects of medication such as diarrhea,   dizziness, double vision, or drowsiness. Does complain of numbness in his right hand intermittently without neck pain. This is relieved by shaking his right hand. He has bilateral shoulder pain and possible rotator cuff disease. The  pain is relieved by not working.  He is independent in activities of daily living. He does not drive a car or handle his financial needs. He exercises at  the gym one or 2 times per week.   REVIEW OF SYSTEMS: Full 14 system review of systems performed and notable only for seizure disorder.    ALLERGIES: No Known Allergies  HOME MEDICATIONS: Outpatient Prescriptions Prior to Visit  Medication Sig Dispense Refill  . Cholecalciferol (VITAMIN D3) 1000 UNITS CAPS Take 1 capsule (1,000 Units total) by mouth daily. 30 capsule   .  Multiple Vitamin tablet Take 1 tablet by mouth daily.    . carbamazepine (TEGRETOL) 200 MG tablet Take 2 tablets (400 mg total) by mouth 2 (two) times daily. 360 tablet 4   No facility-administered medications prior to visit.    PAST MEDICAL HISTORY: Past Medical History  Diagnosis Date  . Seizures     PAST SURGICAL HISTORY: Past Surgical History  Procedure Laterality Date  . Hernia repair  age 18 and 89    x2,    . Eye surgery Right 1967    FAMILY HISTORY: History reviewed. No pertinent family history.  SOCIAL HISTORY:  History   Social History  . Marital Status: Single    Spouse Name: N/A  . Number of Children: 0  . Years of Education: HS   Occupational History  .      [employer]   Social History Main Topics  . Smoking status: Never Smoker   . Smokeless tobacco: Never Used  . Alcohol Use: No  . Drug Use: No  . Sexual Activity: Not on file   Other Topics Concern  . Not on file   Social History Narrative   Patient lives at home with his parents.   Caffeine Use: Drinks tea, coffee, and soda daily.      PHYSICAL EXAM  Filed Vitals:   06/16/14 1346  BP: 98/58  Pulse: 65  Height:  (1.778 m)  Weight: 160 lb 3.2 oz (72.666 kg)    Not recorded      Body mass index is 22.99 kg/(m^2).  GENERAL EXAM: Patient is in no distress; well developed, nourished and groomed; neck is supple  CARDIOVASCULAR: Regular rate and rhythm, no murmurs, no carotid bruits  NEUROLOGIC: MENTAL STATUS: awake, alert, language fluent, comprehension intact, naming intact, fund of knowledge appropriate CRANIAL NERVE: pupils equal and reactive to light, visual fields full to confrontation, extraocular muscles intact, no nystagmus, facial sensation and strength symmetric, hearing intact, palate elevates symmetrically, uvula midline, shoulder shrug symmetric, tongue midline. MOTOR: normal bulk and tone, full strength in the BUE, BLE SENSORY: normal and symmetric to light touch, pinprick, temperature, vibration and proprioception COORDINATION: finger-nose-finger, fine finger movements normal REFLEXES: deep tendon reflexes present and symmetric GAIT/STATION: narrow based gait; able to walk tandem; romberg is negative    DIAGNOSTIC DATA (LABS, IMAGING, TESTING) - I reviewed patient records, labs, notes, testing and imaging myself where available.  Lab Results  Component Value Date   WBC  6.7 05/29/2013      Component Value Date/Time   NA 142 05/29/2013 1530   No results found for: CHOL No results found for: HGBA1C No results found for: VITAMINB12 No results found for: TSH   ASSESSMENT AND PLAN  54 y.o. left-handed white single male who lives with his parents and has a history of mental retardation, complex partial, and secondarily generalized seizures followed on a yearly basis. Doing well.  PLAN: - continue current medications - check CBC, CMP  Return in about 1 year (around 06/16/2015).    Suanne Marker, MD 06/16/2014, 2:41 PM Certified in Neurology, Neurophysiology and Neuroimaging  [facility] [address], [address] [place], [place] [address] [phone]

## 2014-06-17 LAB — COMPREHENSIVE METABOLIC PANEL
ALT: 8 IU/L (ref 0–44)
AST: 13 IU/L (ref 0–40)
Albumin/Globulin Ratio: 2.4 (ref 1.1–2.5)
Albumin: 4.7 g/dL (ref 3.5–5.5)
Alkaline Phosphatase: 98 IU/L (ref 39–117)
BUN/Creatinine Ratio: 14 (ref 9–20)
BUN: 11 mg/dL (ref 6–24)
Bilirubin Total: <0.2 mg/dL (ref 0.0–1.2)
CO2: 25 mmol/L (ref 18–29)
Calcium: 9.3 mg/dL (ref 8.7–10.2)
Chloride: 101 mmol/L (ref 97–108)
Creatinine, Ser: 0.76 mg/dL (ref 0.76–1.27)
GFR calc Af Amer: 122 mL/min/{1.73_m2} (ref >59)
GFR calc non Af Amer: 106 mL/min/{1.73_m2} (ref >59)
Globulin, Total: 2 g/dL (ref 1.5–4.5)
Glucose: 89 mg/dL (ref 65–99)
Potassium: 5 mmol/L (ref 3.5–5.2)
Sodium: 142 mmol/L (ref 134–144)
Total Protein: 6.7 g/dL (ref 6.0–8.5)

## 2014-06-17 LAB — CBC WITH DIFFERENTIAL/PLATELET
Basophils Absolute: 0 10*3/uL (ref 0.0–0.2)
Basos: 0 %
Eos: 5 %
Eosinophils Absolute: 0.3 10*3/uL (ref 0.0–0.4)
HCT: 44 % (ref 37.5–51.0)
Hemoglobin: 15 g/dL (ref 12.6–17.7)
Immature Grans (Abs): 0 10*3/uL (ref 0.0–0.1)
Immature Granulocytes: 0 %
Lymphocytes Absolute: 1.6 10*3/uL (ref 0.7–3.1)
Lymphs: 26 %
MCH: 30.5 pg (ref 26.6–33.0)
MCHC: 34.1 g/dL (ref 31.5–35.7)
MCV: 89 fL (ref 79–97)
Monocytes Absolute: 0.8 10*3/uL (ref 0.1–0.9)
Monocytes: 14 %
Neutrophils Absolute: 3.4 10*3/uL (ref 1.4–7.0)
Neutrophils Relative %: 55 %
Platelets: 219 10*3/uL (ref 150–379)
RBC: 4.92 x10E6/uL (ref 4.14–5.80)
RDW: 13.8 % (ref 12.3–15.4)
WBC: 6.2 10*3/uL (ref 3.4–10.8)

## 2014-08-28 ENCOUNTER — Telehealth: Payer: Self-pay | Admitting: Diagnostic Neuroimaging

## 2014-08-28 NOTE — Telephone Encounter (Signed)
Patient's mother is calling to say thank you for the information given to the patient when prescribing carbamazepine (TEGRETOL) 200 MG tablet. With the information the patient got this Rx for $25.00 and patient's mother just wanted to thank you.This saved the patient so much money.

## 2014-11-13 ENCOUNTER — Telehealth: Payer: Self-pay | Admitting: Diagnostic Neuroimaging

## 2014-11-13 NOTE — Telephone Encounter (Signed)
It appears the patient should have refills remaining.  I called back.  [name] verified they do have refills remaining.  She has a discount card, however, the price of this drug has gone up.  It is more expensive with ins than without.  We spoke about the website ExcellentCoupons.be.  They are familiar with this site, and will download a new voucher for the med.  By viewing the website, it will be least expensive at [facility].  They will get the Rx filled there with voucher, and will call us back if anything further is needed.

## 2014-11-13 NOTE — Telephone Encounter (Signed)
The patient's mother is calling regarding medication carbamazepine (TEGRETOL) 200 MG tablet for the patient. When the patient was seen in June Dr. Marjory Lies had printed a Rx for carbamazepine (TEGRETOL) 200 MG tablet for the patient and the Rx was only $25.11 at [facility]. Can another Rx be printed? The patient has enough medication through Saturday. Please call and advise. Thank you.

## 2015-06-16 ENCOUNTER — Encounter: Payer: Self-pay | Admitting: Diagnostic Neuroimaging

## 2015-06-16 ENCOUNTER — Ambulatory Visit (INDEPENDENT_AMBULATORY_CARE_PROVIDER_SITE_OTHER): Payer: BLUE CROSS/BLUE SHIELD | Admitting: Diagnostic Neuroimaging

## 2015-06-16 VITALS — BP 115/67 | HR 72 | Ht 70.0 in | Wt 159.4 lb

## 2015-06-16 DIAGNOSIS — R6250 Unspecified lack of expected normal physiological development in childhood: Secondary | ICD-10-CM | POA: Diagnosis not present

## 2015-06-16 DIAGNOSIS — G40109 Localization-related (focal) (partial) symptomatic epilepsy and epileptic syndromes with simple partial seizures, not intractable, without status epilepticus: Secondary | ICD-10-CM

## 2015-06-16 MED ORDER — CARBAMAZEPINE 200 MG PO TABS: 400.0000 mg | ORAL_TABLET | Freq: Two times a day (BID) | ORAL | Status: DC

## 2015-06-16 NOTE — Patient Instructions (Signed)
Thank you for coming to see [place] at [facility]. I hope we have been able to provide you high quality care today.  You may receive a patient satisfaction survey over the next few weeks. We would appreciate your feedback and comments so that we may continue to improve ourselves and the health of our patients.  - establish with PCP - continue carbamazepine - I will check labs today   ~~~~~~~~~~~~~~~~~~~~~~~~~~~~~~~~~~~~~~~~~~~~~~~~~~~~~~~~~~~~~~~~~  DR. Mamie Diiorio'S GUIDE TO HAPPY AND HEALTHY LIVING These are some of my general health and wellness recommendations. Some of them may apply to you better than others. Please use common sense as you try these suggestions and feel free to ask me any questions.   ACTIVITY/FITNESS Mental, social, emotional and physical stimulation are very important for brain and body health. Try learning a new activity (arts, music, language, sports, games).  Keep moving your body to the best of your abilities. You can do this at home, inside or outside, the park, community center, gym or anywhere you like. Consider a physical therapist or personal trainer to get started. Consider the app Sworkit. Fitness trackers such as smart-watches, smart-phones or Fitbits can help as well.   NUTRITION Eat more plants: colorful vegetables, nuts, seeds and berries.  Eat less sugar, salt, preservatives and processed foods.  Avoid toxins such as cigarettes and alcohol.  Drink water when you are thirsty. Warm water with a slice of lemon is an excellent morning drink to start the day.  Consider these websites for more information The Nutrition Source (https://www.henry-hernandez.biz/) Precision Nutrition (WindowBlog.ch)   RELAXATION Consider practicing mindfulness meditation or other relaxation techniques such as deep breathing, prayer, yoga, tai chi, massage. See website mindful.org or the apps Headspace or Calm to  help get started.   SLEEP Try to get at least 7-8+ hours sleep per day. Regular exercise and reduced caffeine will help you sleep better. Practice good sleep hygeine techniques. See website sleep.org for more information.   PLANNING Prepare estate planning, living will, healthcare POA documents. Sometimes this is best planned with the help of an attorney. Theconversationproject.org and agingwithdignity.org are excellent resources.;

## 2015-06-16 NOTE — Progress Notes (Signed)
[facility]  PATIENT: [name] DOB: 27-May-1960  REFERRING CLINICIAN:  HISTORY FROM: patient  REASON FOR VISIT: follow up   HISTORICAL  CHIEF COMPLAINT:  Chief Complaint  Patient presents with  . Localization-related epilepsy    rm 7, mother- [name], "no seizure activity"  . Follow-up    yearly    HISTORY OF PRESENT ILLNESS:   UPDATE 06/16/15 ([facility]): Since last visit, doing well. No seizures. Doing well on CBZ 400mg  BID. Using goodrx coupon with good results.   UPDATE 06/16/14 ([facility]): Since last visit, doing well. No seizures. Doing well on CBZ 400mg  BID.  UPDATE 05/29/13 ([facility]P): 55 year old male here for followup seizure disorder. Patient is doing well on carbamazepine 400 mg twice a day. He takes vitamin D, calcium, multivitamin daily. No seizure since 1989.  PRIOR HPI (03/12/12, [name]): 55 year old left-handed white single male who lives with his parents and has a history of mental retardation,complex partial, and secondarily generalized seizures followed on a yearly basis. He has not had any seizures since 1989.  He was first seen by Dr. [name] in January,1977 at age 55. He would feel sick and "couldn't talk". This began  in 1976. When he was in school, teachers would notice that he would drop his penciils and paper and make clicking sounds with his mouth. The episodes would last a few seconds. The patient had been on Klonopin for  hyperactivity" and was switched to phenobarb.  In June 1977 he had 2 days of 3 or 4 seizures. 12/06/1975 he was admitted to [facility] with a  generalized major motor seizure. 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 [name]  and hyperactivity. His developmental  motor milestones were delayed, walking at 14 months,  toilet trained at 6 years, and did not ride a bicycle until 9 or 10 years.He did not pass the third grade and received unsatisfactory grades in grammar school. EEG 9/19/ 77  was normal. He was treated with Cylert for hyperactivity and phenobarbital 80 mg per day.He began having seizures about once every 3 months. These were complex partial seizures. He began Tegretol in 1984 after Depakote, Phenobarbital, and Dilantin were not successful and has had good results with that medication. He denies macropsia, micropsia, dj vu, strange odors or tastes. Last bone density study 04/26/2011 did not show evidence of osteopenia in the left hip but a decrease in bone density in the lumbar spine. Last CBC, CMP, and carbamazepine level 03/21/2011 were normal with a  trough carbamazepine level of 4.6 micrograms per deciliter. A CT scan of the brain with and without contrast enhancement in 1977 was normal. He  is independent in his activities of daily living. He works loading and unloading trucks.His parents have decided that they did not wish him  to drive a car. He has no side effects of medication such as diarrhea,   dizziness, double vision, or drowsiness. Does complain of numbness in his right hand intermittently without neck pain. This is relieved by shaking his right hand. He has bilateral shoulder pain and possible rotator cuff disease. The  pain is relieved by not working.  He is independent in activities of daily living. He does not drive a car or handle his financial needs. He exercises at  the gym one or 2 times per week.   REVIEW OF SYSTEMS: Full 14 system review of systems performed and notable only for seizure disorder.  ALLERGIES: No Known Allergies  HOME MEDICATIONS: Outpatient Prescriptions Prior to Visit  Medication Sig Dispense Refill  . calcium carbonate 200 MG capsule Take 250 mg by mouth.    . Cholecalciferol (VITAMIN D3) 1000 UNITS CAPS Take 1 capsule (1,000 Units total) by mouth daily. 30 capsule   . Multiple Vitamin tablet Take 1 tablet by mouth daily.    . carbamazepine (TEGRETOL) 200 MG tablet Take 2 tablets (400 mg total) by mouth 2 (two) times daily. 360  tablet 4   No facility-administered medications prior to visit.    PAST MEDICAL HISTORY: Past Medical History  Diagnosis Date  . Seizures (HCC)     PAST SURGICAL HISTORY: Past Surgical History  Procedure Laterality Date  . Hernia repair  age 63 and 54    x2,  . Eye surgery Right 1967    FAMILY HISTORY: History reviewed. No pertinent family history.  SOCIAL HISTORY:  Social History   Social History  . Marital Status: Single    Spouse Name: N/A  . Number of Children: 0  . Years of Education: HS   Occupational History  .      [employer]   Social History Main Topics  . Smoking status: Never Smoker   . Smokeless tobacco: Never Used  . Alcohol Use: No  . Drug Use: No  . Sexual Activity: Not on file   Other Topics Concern  . Not on file   Social History Narrative   Patient lives at home with his parents.   Caffeine Use: Drinks tea, coffee, and soda daily.      PHYSICAL EXAM  Filed Vitals:   06/16/15 1426  BP: 115/67  Pulse: 72  Height:  (1.778 m)  Weight: 159 lb 6.4 oz (72.303 kg)    Not recorded      Body mass index is 22.87 kg/(m^2).  GENERAL EXAM: Patient is in no distress; well developed, nourished and groomed; neck is supple  CARDIOVASCULAR: Regular rate and rhythm, no murmurs, no carotid bruits  NEUROLOGIC: MENTAL STATUS: awake, alert, language fluent, comprehension intact, naming intact, fund of knowledge appropriate CRANIAL NERVE: pupils equal and reactive to light, visual fields full to confrontation, extraocular muscles intact, no nystagmus, facial sensation and strength symmetric, hearing intact, palate elevates symmetrically, uvula midline, shoulder shrug symmetric, tongue midline. MOTOR: normal bulk and tone, full strength in the BUE, BLE SENSORY: normal and symmetric to light touch, pinprick, temperature, vibration and proprioception COORDINATION: finger-nose-finger, fine finger movements normal REFLEXES: deep tendon reflexes  present and symmetric GAIT/STATION: narrow based gait; able to walk tandem; romberg is negative    DIAGNOSTIC DATA (LABS, IMAGING, TESTING) - I reviewed patient records, labs, notes, testing and imaging myself where available.  Lab Results  Component Value Date   WBC 6.2 06/16/2014      Component Value Date/Time   NA 142 06/16/2014 1501   No results found for: CHOL No results found for: HGBA1C No results found for: VITAMINB12 No results found for: TSH   ASSESSMENT AND PLAN  55 y.o. left-handed white single male who lives with his parents and has a history of mental retardation, complex partial, and secondarily generalized seizures followed on a yearly basis. Doing well.  Dx:  Localization-related epilepsy (HCC)  Developmental delay    PLAN: - continue current medications - check CBC, CMP  Orders Placed This Encounter  Procedures  . CBC with Differential/Platelet  . Comprehensive metabolic panel   Meds ordered this encounter  Medications  .  carbamazepine (TEGRETOL) 200 MG tablet    Sig: Take 2 tablets (400 mg total) by mouth 2 (two) times daily.    Dispense:  360 tablet    Refill:  4   Return in about 1 year (around 06/15/2016).    Suanne Marker, MD 06/16/2015, 2:37 PM Certified in Neurology, Neurophysiology and Neuroimaging  [facility] [address] [place], [place] [address] [phone]

## 2015-06-17 ENCOUNTER — Telehealth: Payer: Self-pay | Admitting: *Deleted

## 2015-06-17 LAB — CBC WITH DIFFERENTIAL/PLATELET
Basophils Absolute: 0 10*3/uL (ref 0.0–0.2)
Basos: 0 %
EOS (ABSOLUTE): 0.2 10*3/uL (ref 0.0–0.4)
Eos: 4 %
Hematocrit: 40.5 % (ref 37.5–51.0)
Hemoglobin: 13.9 g/dL (ref 12.6–17.7)
Immature Grans (Abs): 0 10*3/uL (ref 0.0–0.1)
Immature Granulocytes: 0 %
Lymphocytes Absolute: 1.7 10*3/uL (ref 0.7–3.1)
Lymphs: 30 %
MCH: 30 pg (ref 26.6–33.0)
MCHC: 34.3 g/dL (ref 31.5–35.7)
MCV: 87 fL (ref 79–97)
Monocytes Absolute: 0.6 10*3/uL (ref 0.1–0.9)
Monocytes: 11 %
Neutrophils Absolute: 3.1 10*3/uL (ref 1.4–7.0)
Neutrophils: 55 %
Platelets: 196 10*3/uL (ref 150–379)
RBC: 4.64 x10E6/uL (ref 4.14–5.80)
RDW: 14.3 % (ref 12.3–15.4)
WBC: 5.6 10*3/uL (ref 3.4–10.8)

## 2015-06-17 LAB — COMPREHENSIVE METABOLIC PANEL
ALT: 12 IU/L (ref 0–44)
AST: 18 IU/L (ref 0–40)
Albumin/Globulin Ratio: 1.8 (ref 1.2–2.2)
Albumin: 4.4 g/dL (ref 3.5–5.5)
Alkaline Phosphatase: 82 IU/L (ref 39–117)
BUN/Creatinine Ratio: 19 (ref 9–20)
BUN: 13 mg/dL (ref 6–24)
Bilirubin Total: <0.2 mg/dL (ref 0.0–1.2)
CO2: 23 mmol/L (ref 18–29)
Calcium: 9.1 mg/dL (ref 8.7–10.2)
Chloride: 99 mmol/L (ref 96–106)
Creatinine, Ser: 0.68 mg/dL — ABNORMAL LOW (ref 0.76–1.27)
GFR calc Af Amer: 127 mL/min/{1.73_m2} (ref >59)
GFR calc non Af Amer: 110 mL/min/{1.73_m2} (ref >59)
Globulin, Total: 2.5 g/dL (ref 1.5–4.5)
Glucose: 104 mg/dL — ABNORMAL HIGH (ref 65–99)
Potassium: 4.5 mmol/L (ref 3.5–5.2)
Sodium: 139 mmol/L (ref 134–144)
Total Protein: 6.9 g/dL (ref 6.0–8.5)

## 2015-06-17 NOTE — Telephone Encounter (Signed)
LVM for mother, [name] requesting call back for lab results. Left name, number.

## 2015-06-19 NOTE — Telephone Encounter (Signed)
Called mobile #, unable to LVM. Spoke with patient's mother and informed her that her son's lab results are unremarkable. She verbalized understanding, appreciation. She then stated "we are working on getting him a primary care doctor".

## 2015-07-29 ENCOUNTER — Other Ambulatory Visit: Payer: Self-pay | Admitting: Diagnostic Neuroimaging

## 2015-12-15 ENCOUNTER — Encounter: Payer: Self-pay | Admitting: Family Medicine

## 2015-12-15 ENCOUNTER — Ambulatory Visit (INDEPENDENT_AMBULATORY_CARE_PROVIDER_SITE_OTHER): Payer: BLUE CROSS/BLUE SHIELD | Admitting: Family Medicine

## 2015-12-15 VITALS — BP 124/70 | HR 74 | Temp 98.6°F | Ht 69.0 in | Wt 165.0 lb

## 2015-12-15 DIAGNOSIS — Z7189 Other specified counseling: Secondary | ICD-10-CM

## 2015-12-15 DIAGNOSIS — Z0000 Encounter for general adult medical examination without abnormal findings: Secondary | ICD-10-CM | POA: Diagnosis not present

## 2015-12-15 DIAGNOSIS — Z1211 Encounter for screening for malignant neoplasm of colon: Secondary | ICD-10-CM

## 2015-12-15 DIAGNOSIS — G40109 Localization-related (focal) (partial) symptomatic epilepsy and epileptic syndromes with simple partial seizures, not intractable, without status epilepticus: Secondary | ICD-10-CM

## 2015-12-15 NOTE — Progress Notes (Signed)
CPE- See plan.  Routine anticipatory guidance given to patient.  See health maintenance. Tetanus shot prev done, <10 years.  Patient and mother will check on old records.   PNA and shingles shots not due.  Flu shot encouraged.   Living will d/w pt.  Parents designated if patient were incapacitated.  D/w patient [id number]:options for colon cancer screening, including IFOB vs. colonoscopy.  Risks and benefits of both were discussed and patient voiced understanding.  Pt elects WUJ:WJXBfor:IFOB.  Prostate cancer screening and PSA options (with potential risks and benefits of testing vs not testing) were discussed along with recent recs/guidelines.  He can consider testing PSA.   Diet and exercise d/w pt.  Doing well on both.  Physical job.  Walking for exercise.  Working out at [employer] gym.  Healthy diet encouraged.   HIV and HCV screening d/w pt.  He can consider.    History of seizure disorder. Compliant with baseline medication. No adverse effects of medication. Follows yearly with neurology. He has not had a seizure in decades per his report. He is still living at home with his parents.  PMH and SH reviewed  Meds, vitals, and allergies reviewed.   ROS: Per HPI.  Unless specifically indicated otherwise in HPI, the patient denies:  General: fever. Eyes: acute vision changes ENT: sore throat Cardiovascular: chest pain Respiratory: SOB GI: vomiting GU: dysuria Musculoskeletal: acute back pain Derm: acute rash Neuro: acute motor dysfunction Psych: worsening mood Endocrine: polydipsia Heme: bleeding Allergy: hayfever  GEN: nad, alert and oriented HEENT: mucous membranes moist NECK: supple w/o LA CV: rrr. PULM: ctab, no inc wob ABD: soft, +bs EXT: no edema SKIN: no acute rash CN 2-12 wnl B, S/S/DTR wnl x4

## 2015-12-15 NOTE — Patient Instructions (Addendum)
Check on your last tetanus.   I would get a flu shot each fall.   Go to the lab on the way out.  We'll contact you with your lab report (stool cards). Take care.  Glad to see you.  Update me as needed.

## 2015-12-15 NOTE — Progress Notes (Signed)
Pre visit review using our clinic review tool, if applicable. No additional management support is needed unless otherwise documented below in the visit note. 

## 2015-12-16 ENCOUNTER — Encounter: Payer: Self-pay | Admitting: Family Medicine

## 2015-12-16 DIAGNOSIS — Z0000 Encounter for general adult medical examination without abnormal findings: Secondary | ICD-10-CM | POA: Insufficient documentation

## 2015-12-16 DIAGNOSIS — Z7189 Other specified counseling: Secondary | ICD-10-CM | POA: Insufficient documentation

## 2015-12-16 NOTE — Assessment & Plan Note (Signed)
Tetanus shot prev done, <10 years.  Patient and mother will check on old records.   PNA and shingles shots not due.  Flu shot encouraged.   Living will d/w pt.  Parents designated if patient were incapacitated.  D/w patient [id number]:options for colon cancer screening, including IFOB vs. colonoscopy.  Risks and benefits of both were discussed and patient voiced understanding.  Pt elects JXB:JYNWfor:IFOB.  Prostate cancer screening and PSA options (with potential risks and benefits of testing vs not testing) were discussed along with recent recs/guidelines.  He can consider testing PSA.   Diet and exercise d/w pt.  Doing well on both.  Physical job.  Walking for exercise.  Working out at [employer].  Healthy diet encouraged.   HIV and HCV screening d/w pt.  He can consider.

## 2015-12-16 NOTE — Assessment & Plan Note (Signed)
Living will d/w pt.  Parents designated if patient were incapacitated.  °

## 2015-12-16 NOTE — Assessment & Plan Note (Signed)
Per neuro 

## 2016-01-01 ENCOUNTER — Encounter: Payer: Self-pay | Admitting: Family Medicine

## 2016-01-01 NOTE — Progress Notes (Signed)
Note from patient.  Verified that Td done 03/18/12.  Entered in EMR.

## 2016-01-11 ENCOUNTER — Encounter: Payer: Self-pay | Admitting: Family Medicine

## 2016-06-16 ENCOUNTER — Ambulatory Visit: Payer: BLUE CROSS/BLUE SHIELD | Admitting: Diagnostic Neuroimaging

## 2016-06-28 ENCOUNTER — Ambulatory Visit: Payer: BLUE CROSS/BLUE SHIELD | Admitting: Diagnostic Neuroimaging

## 2016-08-02 ENCOUNTER — Other Ambulatory Visit: Payer: Self-pay | Admitting: Diagnostic Neuroimaging

## 2016-10-04 ENCOUNTER — Encounter: Payer: Self-pay | Admitting: Diagnostic Neuroimaging

## 2016-10-04 ENCOUNTER — Ambulatory Visit (INDEPENDENT_AMBULATORY_CARE_PROVIDER_SITE_OTHER): Payer: BLUE CROSS/BLUE SHIELD | Admitting: Diagnostic Neuroimaging

## 2016-10-04 VITALS — BP 117/72 | HR 78 | Ht 69.0 in | Wt 153.6 lb

## 2016-10-04 DIAGNOSIS — R6250 Unspecified lack of expected normal physiological development in childhood: Secondary | ICD-10-CM

## 2016-10-04 DIAGNOSIS — G40109 Localization-related (focal) (partial) symptomatic epilepsy and epileptic syndromes with simple partial seizures, not intractable, without status epilepticus: Secondary | ICD-10-CM | POA: Diagnosis not present

## 2016-10-04 MED ORDER — CARBAMAZEPINE 200 MG PO TABS: 400.0000 mg | ORAL_TABLET | Freq: Two times a day (BID) | ORAL | 4 refills | Status: DC

## 2016-10-04 NOTE — Progress Notes (Signed)
[facility]  PATIENT: [name] DOB: 1960/04/02  REFERRING CLINICIAN:  HISTORY FROM: patient  REASON FOR VISIT: follow up   HISTORICAL  CHIEF COMPLAINT:  Chief Complaint  Patient presents with  . Follow-up  . Seizures    none    HISTORY OF PRESENT ILLNESS:   UPDATE 10/04/16: Since last visit, doing well. No seizures. Tolerating CBZ 400mg  twice a day. No other new issues.  UPDATE 06/16/15 (VRP): Since last visit, doing well. No seizures. Doing well on CBZ 400mg  BID. Using goodrx coupon with good results.   UPDATE 06/16/14 (VRP): Since last visit, doing well. No seizures. Doing well on CBZ 400mg  BID.  UPDATE 05/29/13 (VRP): 56 year old male here for followup seizure disorder. Patient is doing well on carbamazepine 400 mg twice a day. He takes vitamin D, calcium, multivitamin daily. No seizure since 1989.  PRIOR HPI (03/12/12, [name]): 56 year old left-handed white single male who lives with his parents and has a history of mental retardation,complex partial, and secondarily generalized seizures followed on a yearly basis. He has not had any seizures since 1989.  He was first seen by Dr. [name] in January,1977 at age 56. He would feel sick and "couldn't talk". This began  in 1976. When he was in school, teachers would notice that he would drop his penciils and paper and make clicking sounds with his mouth. The episodes would last a few seconds. The patient had been on Klonopin for  hyperactivity" and was switched to phenobarb.  In June 1977 he had 2 days of 3 or 4 seizures. 12/06/1975 [facility] with a  generalized major motor seizure. 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 [name]  and hyperactivity. His developmental  motor milestones were delayed, walking at 14 months,  toilet trained at 6 years, and did not ride a bicycle until 9 or 10 years.He did not pass the third grade and received  unsatisfactory grades in grammar school. EEG 9/19/ 77 was normal. He was treated with Cylert for hyperactivity and phenobarbital 80 mg per day.He began having seizures about once every 3 months. These were complex partial seizures. He began Tegretol in 1984 after Depakote, Phenobarbital, and Dilantin were not successful and has had good results with that medication. He denies macropsia, micropsia, dj vu, strange odors or tastes. Last bone density study 04/26/2011 did not show evidence of osteopenia in the left hip but a decrease in bone density in the lumbar spine. Last CBC, CMP, and carbamazepine level 03/21/2011 were normal with a  trough carbamazepine level of 4.6 micrograms per deciliter. A CT scan of the brain with and without contrast enhancement in 1977 was normal. He  is independent in his activities of daily living. He works loading and unloading trucks.His parents have decided that they did not wish him  to drive a car. He has no side effects of medication such as diarrhea,   dizziness, double vision, or drowsiness. Does complain of numbness in his right hand intermittently without neck pain. This is relieved by shaking his right hand. He has bilateral shoulder pain and possible rotator cuff disease. The  pain is relieved by not working.  He is independent in activities of daily living. He does not drive a car or handle his financial needs. He exercises at  the gym one or 2 times per week.   REVIEW OF SYSTEMS: Full 14 system review of systems  performed and negative except: seizure disorder.    ALLERGIES: No Known Allergies  HOME MEDICATIONS: Outpatient Medications Prior to Visit  Medication Sig Dispense Refill  . Cholecalciferol (VITAMIN D3) 1000 UNITS CAPS Take 1 capsule (1,000 Units total) by mouth daily. 30 capsule   . carbamazepine (TEGRETOL) 200 MG tablet TAKE 2 TABLETS TWICE A DAY 360 tablet 3   No facility-administered medications prior to visit.     PAST MEDICAL HISTORY: Past  Medical History:  Diagnosis Date  . Seizures (HCC)     PAST SURGICAL HISTORY: Past Surgical History:  Procedure Laterality Date  . EYE SURGERY Right 1967   ocular muscle surgery, for alignment  . HERNIA REPAIR  age 17 and 29   x2,    FAMILY HISTORY: Family History  Problem Relation Age of Onset  . Healthy Mother   . Hypertension Father   . Atrial fibrillation Father   . Seizures Neg Hx   . Colon cancer Neg Hx   . Prostate cancer Neg Hx     SOCIAL HISTORY:  Social History   Social History  . Marital status: Single    Spouse name: N/A  . Number of children: 0  . Years of education: HS   Occupational History  .  [employer]    [employer]   Social History Main Topics  . Smoking status: Never Smoker  . Smokeless tobacco: Never Used  . Alcohol use No  . Drug use: No  . Sexual activity: Not on file   Other Topics Concern  . Not on file   Social History Narrative   Patient lives at home with his parents.   Caffeine Use: Drinks tea, coffee, and soda daily.    [name], [place] [employer] fan.     Works in Teaching laboratory technician.     PHYSICAL EXAM  Vitals:   10/04/16 1334  BP: 117/72  Pulse: 78  Weight: 153 lb 9.6 oz (69.7 kg)  Height: 5\' 9"  (1.753 m)    Not recorded      Body mass index is 22.68 kg/m.  GENERAL EXAM: Patient is in no distress; well developed, nourished and groomed; neck is supple  CARDIOVASCULAR: Regular rate and rhythm, no murmurs, no carotid bruits  NEUROLOGIC: MENTAL STATUS: awake, alert, language fluent, comprehension intact, naming intact, fund of knowledge appropriate CRANIAL NERVE: pupils equal and reactive to light, visual fields full to confrontation, extraocular muscles intact, no nystagmus, facial sensation and strength symmetric, hearing intact, palate elevates symmetrically, uvula midline, shoulder shrug symmetric, tongue midline. MOTOR: normal bulk and tone, full strength in the BUE, BLE SENSORY: normal and symmetric to light  touch, pinprick, temperature, vibration and proprioception COORDINATION: finger-nose-finger, fine finger movements normal REFLEXES: deep tendon reflexes present and symmetric GAIT/STATION: narrow based gait; able to walk tandem; romberg is negative    DIAGNOSTIC DATA (LABS, IMAGING, TESTING) - I reviewed patient records, labs, notes, testing and imaging myself where available.  Lab Results  Component Value Date   WBC 5.6 06/16/2015      Component Value Date/Time   NA 139 06/16/2015 1455   No results found for: CHOL No results found for: HGBA1C No results found for: VITAMINB12 No results found for: TSH   ASSESSMENT AND PLAN  56 y.o. left-handed white single male who lives with his parents and has a history of mental retardation, complex partial, and secondarily generalized seizures followed on a yearly basis. Doing well.   Dx:  Localization-related epilepsy (HCC) - Plan: CBC with  Differential/Platelet, Comprehensive metabolic panel  Developmental delay - Plan: CBC with Differential/Platelet, Comprehensive metabolic panel   PLAN:  I spent 15 minutes of face to face time with patient. Greater than 50% of time was spent in counseling and coordination of care with patient. In summary we discussed:   SEIZURE DISORDER (established problem, stable) - continue CBZ 400mg  twice a day - check CBC, CMP  Meds ordered this encounter  Medications  . carbamazepine (TEGRETOL) 200 MG tablet    Sig: Take 2 tablets (400 mg total) by mouth 2 (two) times daily.    Dispense:  360 tablet    Refill:  4   Return in about 1 year (around 10/04/2017).    Suanne Marker, MD 10/04/2016, 2:31 PM Certified in Neurology, Neurophysiology and Neuroimaging  [facility] [address], [address] [place], [place] [address] [phone]

## 2016-10-05 LAB — CBC WITH DIFFERENTIAL/PLATELET
Basophils Absolute: 0 10*3/uL (ref 0.0–0.2)
Basos: 0 %
EOS (ABSOLUTE): 0.2 10*3/uL (ref 0.0–0.4)
Eos: 3 %
Hematocrit: 43.7 % (ref 37.5–51.0)
Hemoglobin: 14.5 g/dL (ref 13.0–17.7)
Immature Grans (Abs): 0 10*3/uL (ref 0.0–0.1)
Immature Granulocytes: 0 %
Lymphocytes Absolute: 1.8 10*3/uL (ref 0.7–3.1)
Lymphs: 30 %
MCH: 30 pg (ref 26.6–33.0)
MCHC: 33.2 g/dL (ref 31.5–35.7)
MCV: 91 fL (ref 79–97)
Monocytes Absolute: 0.6 10*3/uL (ref 0.1–0.9)
Monocytes: 10 %
Neutrophils Absolute: 3.6 10*3/uL (ref 1.4–7.0)
Neutrophils: 57 %
Platelets: 222 10*3/uL (ref 150–379)
RBC: 4.83 x10E6/uL (ref 4.14–5.80)
RDW: 14.1 % (ref 12.3–15.4)
WBC: 6.2 10*3/uL (ref 3.4–10.8)

## 2016-10-05 LAB — COMPREHENSIVE METABOLIC PANEL
ALT: 8 IU/L (ref 0–44)
AST: 14 IU/L (ref 0–40)
Albumin/Globulin Ratio: 2 (ref 1.2–2.2)
Albumin: 4.7 g/dL (ref 3.5–5.5)
Alkaline Phosphatase: 71 IU/L (ref 39–117)
BUN/Creatinine Ratio: 19 (ref 9–20)
BUN: 16 mg/dL (ref 6–24)
Bilirubin Total: <0.2 mg/dL (ref 0.0–1.2)
CO2: 25 mmol/L (ref 20–29)
Calcium: 9.4 mg/dL (ref 8.7–10.2)
Chloride: 100 mmol/L (ref 96–106)
Creatinine, Ser: 0.85 mg/dL (ref 0.76–1.27)
GFR calc Af Amer: 114 mL/min/{1.73_m2} (ref >59)
GFR calc non Af Amer: 99 mL/min/{1.73_m2} (ref >59)
Globulin, Total: 2.4 g/dL (ref 1.5–4.5)
Glucose: 97 mg/dL (ref 65–99)
Potassium: 4.9 mmol/L (ref 3.5–5.2)
Sodium: 139 mmol/L (ref 134–144)
Total Protein: 7.1 g/dL (ref 6.0–8.5)

## 2016-10-11 ENCOUNTER — Telehealth: Payer: Self-pay | Admitting: *Deleted

## 2016-10-11 NOTE — Telephone Encounter (Signed)
Spoke to mother of pt, (nancy) ok per DPR.  Relayed that per Dr. Marjory LiesPenumalli that lab results are normal.  She will relay to pt.

## 2016-10-11 NOTE — Telephone Encounter (Signed)
-----   Message from Suanne MarkerVikram R Penumalli, MD sent at 10/11/2016  2:34 PM EDT ----- Normal labs. Please call patient. -VRP

## 2017-04-26 ENCOUNTER — Encounter: Payer: Self-pay | Admitting: Diagnostic Neuroimaging

## 2017-04-28 DIAGNOSIS — H35373 Puckering of macula, bilateral: Secondary | ICD-10-CM | POA: Diagnosis not present

## 2017-04-28 DIAGNOSIS — H40013 Open angle with borderline findings, low risk, bilateral: Secondary | ICD-10-CM | POA: Diagnosis not present

## 2017-04-28 DIAGNOSIS — H2513 Age-related nuclear cataract, bilateral: Secondary | ICD-10-CM | POA: Diagnosis not present

## 2017-04-28 DIAGNOSIS — H25013 Cortical age-related cataract, bilateral: Secondary | ICD-10-CM | POA: Diagnosis not present

## 2017-07-27 ENCOUNTER — Other Ambulatory Visit: Payer: Self-pay | Admitting: Diagnostic Neuroimaging

## 2017-07-27 MED ORDER — CARBAMAZEPINE 200 MG PO TABS: 400.0000 mg | ORAL_TABLET | Freq: Two times a day (BID) | ORAL | 4 refills | Status: DC

## 2017-07-27 NOTE — Telephone Encounter (Signed)
Pts mother called requesting refill for carbamazepine (TEGRETOL) 200 MG tablet [facility] till his upcoming appt in October.

## 2017-07-27 NOTE — Telephone Encounter (Signed)
Tegretol refilled to [facility] at patient's request.

## 2017-09-20 ENCOUNTER — Ambulatory Visit: Payer: BLUE CROSS/BLUE SHIELD | Admitting: Diagnostic Neuroimaging

## 2017-11-09 ENCOUNTER — Telehealth: Payer: Self-pay | Admitting: Family Medicine

## 2017-11-09 NOTE — Telephone Encounter (Signed)
Pt's mom dropped off form to be filled out for insurance. Advised of possible charge and possibility of needing an office visit. Requesting it to be mailed to patient after completion and faxed. Needs to be faxed by 8/31. Placed in rx tower.

## 2017-11-09 NOTE — Telephone Encounter (Signed)
Form placed in Dr. Duncan's In Box. 

## 2017-11-10 NOTE — Telephone Encounter (Signed)
He has not been seen here in years so I can't fill it out.  OV when possible.  Thanks.

## 2017-11-13 NOTE — Telephone Encounter (Signed)
Called pt and got no answer. No voicemail to leave message.

## 2017-12-20 ENCOUNTER — Encounter: Payer: Self-pay | Admitting: Diagnostic Neuroimaging

## 2017-12-20 ENCOUNTER — Ambulatory Visit (INDEPENDENT_AMBULATORY_CARE_PROVIDER_SITE_OTHER): Payer: BLUE CROSS/BLUE SHIELD | Admitting: Diagnostic Neuroimaging

## 2017-12-20 VITALS — BP 124/66 | HR 74 | Ht 70.5 in | Wt 152.8 lb

## 2017-12-20 DIAGNOSIS — R6250 Unspecified lack of expected normal physiological development in childhood: Secondary | ICD-10-CM | POA: Diagnosis not present

## 2017-12-20 DIAGNOSIS — G40109 Localization-related (focal) (partial) symptomatic epilepsy and epileptic syndromes with simple partial seizures, not intractable, without status epilepticus: Secondary | ICD-10-CM

## 2017-12-20 MED ORDER — CARBAMAZEPINE 200 MG PO TABS: 400.0000 mg | ORAL_TABLET | Freq: Two times a day (BID) | ORAL | 4 refills | Status: DC

## 2017-12-20 NOTE — Progress Notes (Signed)
[facility]  PATIENT: [name] DOB: 07/02/1960  REFERRING CLINICIAN:  HISTORY FROM: patient and mother REASON FOR VISIT: follow up   HISTORICAL  CHIEF COMPLAINT:  Chief Complaint  Patient presents with  . Epilepsy    rm 6, mom- Harriett Sine, "doing well, no seizure activity"   . Follow-up    one year    HISTORY OF PRESENT ILLNESS:   UPDATE (12/20/17, VRP): Since last visit, doing well. No seizures. No alleviating or aggravating factors. Tolerating CBZ.    UPDATE 10/04/16: Since last visit, doing well. No seizures. Tolerating CBZ 400mg  twice a day. No other new issues.  UPDATE 06/16/15 (VRP): Since last visit, doing well. No seizures. Doing well on CBZ 400mg  BID. Using goodrx coupon with good results.   UPDATE 06/16/14 (VRP): Since last visit, doing well. No seizures. Doing well on CBZ 400mg  BID.  UPDATE 05/29/13 (VRP): 57 year old male here for followup seizure disorder. Patient is doing well on carbamazepine 400 mg twice a day. He takes vitamin D, calcium, multivitamin daily. No seizure since 1989.  PRIOR HPI (03/12/12, Dr. Sandria Manly): 56 year old left-handed white single male who lives with his parents and has a history of mental retardation,complex partial, and secondarily generalized seizures followed on a yearly basis. He has not had any seizures since 1989.  He was first seen by Dr. Ninetta Lights in January,1977 at age 33. He would feel sick and "couldn't talk". This began  in 1976. When he was in school, teachers would notice that he would drop his penciils and paper and make clicking sounds with his mouth. The episodes would last a few seconds. The patient had been on Klonopin for  hyperactivity" and was switched to phenobarb.  In June 1977 he had 2 days of 3 or 4 seizures. 12/06/1975 he was admitted to [facility] with a  generalized major motor seizure. 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 Lonell Face  and hyperactivity. His  developmental  motor milestones were delayed, walking at 14 months,  toilet trained at 6 years, and did not ride a bicycle until 9 or 10 years.He did not pass the third grade and received unsatisfactory grades in grammar school. EEG 9/19/ 77 was normal. He was treated with Cylert for hyperactivity and phenobarbital 80 mg per day.He began having seizures about once every 3 months. These were complex partial seizures. He began Tegretol in 1984 after Depakote, Phenobarbital, and Dilantin were not successful and has had good results with that medication. He denies macropsia, micropsia, dj vu, strange odors or tastes. Last bone density study 04/26/2011 did not show evidence of osteopenia in the left hip but a decrease in bone density in the lumbar spine. Last CBC, CMP, and carbamazepine level 03/21/2011 were normal with a  trough carbamazepine level of 4.6 micrograms per deciliter. A CT scan of the brain with and without contrast enhancement in 1977 was normal. He  is independent in his activities of daily living. He works loading and unloading trucks.His parents have decided that they did not wish him  to drive a car. He has no side effects of medication such as diarrhea,   dizziness, double vision, or drowsiness. Does complain of numbness in his right hand intermittently without neck pain. This is relieved by shaking his right hand. He has bilateral shoulder pain and possible rotator cuff disease. The  pain is relieved by not working.  He is independent in activities of daily  living. He does not drive a car or handle his financial needs. He exercises at  the gym one or 2 times per week.   REVIEW OF SYSTEMS: Full 14 system review of systems performed and negative except: speech diff.    ALLERGIES: No Known Allergies  HOME MEDICATIONS: Outpatient Medications Prior to Visit  Medication Sig Dispense Refill  . carbamazepine (TEGRETOL) 200 MG tablet Take 2 tablets (400 mg total) by mouth 2 (two) times daily. 360  tablet 4  . Cholecalciferol (VITAMIN D3) 1000 UNITS CAPS Take 1 capsule (1,000 Units total) by mouth daily. 30 capsule    No facility-administered medications prior to visit.     PAST MEDICAL HISTORY: Past Medical History:  Diagnosis Date  . Seizures (HCC)     PAST SURGICAL HISTORY: Past Surgical History:  Procedure Laterality Date  . EYE SURGERY Right 1967   ocular muscle surgery, for alignment  . HERNIA REPAIR  age 22 and 47   x2,    FAMILY HISTORY: Family History  Problem Relation Age of Onset  . Healthy Mother   . Hypertension Father   . Atrial fibrillation Father   . Seizures Neg Hx   . Colon cancer Neg Hx   . Prostate cancer Neg Hx     SOCIAL HISTORY:  Social History   Socioeconomic History  . Marital status: Single    Spouse name: Not on file  . Number of children: 0  . Years of education: HS  . Highest education level: Not on file  Occupational History    Employer: [employer]    Comment: [employer]  Social Needs  . Financial resource strain: Not on file  . Food insecurity:    Worry: Not on file    Inability: Not on file  . Transportation needs:    Medical: Not on file    Non-medical: Not on file  Tobacco Use  . Smoking status: Never Smoker  . Smokeless tobacco: Never Used  Substance and Sexual Activity  . Alcohol use: No  . Drug use: No  . Sexual activity: Not on file  Lifestyle  . Physical activity:    Days per week: Not on file    Minutes per session: Not on file  . Stress: Not on file  Relationships  . Social connections:    Talks on phone: Not on file    Gets together: Not on file    Attends religious service: Not on file    Active member of club or organization: Not on file    Attends meetings of clubs or organizations: Not on file    Relationship status: Not on file  . Intimate partner violence:    Fear of current or ex partner: Not on file    Emotionally abused: Not on file    Physically abused: Not on file    Forced  sexual activity: Not on file  Other Topics Concern  . Not on file  Social History Narrative   Patient lives at home with his parents.   Caffeine Use: Drinks tea, coffee, and soda daily.    [place]rtle, [place] [employer] fan.     Works in Teaching laboratory technician.     PHYSICAL EXAM  GENERAL EXAM/CONSTITUTIONAL: Vitals:  Vitals:   12/20/17 1349  BP: 124/66  Pulse: 74  Weight: 152 lb 12.8 oz (69.3 kg)  Height: 5' 10.5" (1.791 m)     Body mass index is 21.61 kg/m. Wt Readings from Last 3 Encounters:  12/20/17 152  lb 12.8 oz (69.3 kg)  10/04/16 153 lb 9.6 oz (69.7 kg)  12/15/15 165 lb (74.8 kg)     Patient is in no distress; well developed, nourished and groomed; neck is supple  CARDIOVASCULAR:  Examination of carotid arteries is normal; no carotid bruits  Regular rate and rhythm, no murmurs  Examination of peripheral vascular system by observation and palpation is normal  EYES:  Ophthalmoscopic exam of optic discs and posterior segments is normal; no papilledema or hemorrhages  No exam data present  MUSCULOSKELETAL:  Gait, strength, tone, movements noted in Neurologic exam below  NEUROLOGIC: MENTAL STATUS:  No flowsheet data found.  awake, alert, oriented to person, place and time  recent and remote memory intact  normal attention and concentration  language fluent, comprehension intact, naming intact  fund of knowledge appropriate  CRANIAL NERVE:   2nd - no papilledema on fundoscopic exam  2nd, 3rd, 4th, 6th - pupils equal and reactive to light, visual fields full to confrontation, extraocular muscles intact, no nystagmus  5th - facial sensation symmetric  7th - facial strength symmetric  8th - hearing intact  9th - palate elevates symmetrically, uvula midline  11th - shoulder shrug symmetric  12th - tongue protrusion midline  HOARSE, STRANGLED SPEECH  MOTOR:   normal bulk and tone, full strength in the BUE, BLE  SENSORY:   normal and symmetric to  light touch  COORDINATION:   finger-nose-finger, fine finger movements normal  REFLEXES:   deep tendon reflexes present and symmetric  GAIT/STATION:   narrow based gait; able to walk tandem      DIAGNOSTIC DATA (LABS, IMAGING, TESTING) - I reviewed patient records, labs, notes, testing and imaging myself where available.  Lab Results  Component Value Date   WBC 6.2 10/04/2016      Component Value Date/Time   NA 139 10/04/2016 1437   No results found for: CHOL No results found for: HGBA1C No results found for: VITAMINB12 No results found for: TSH   ASSESSMENT AND PLAN  57 y.o. left-handed white single male who lives with his parents and has a history of mental retardation, complex partial, and secondarily generalized seizures followed on a yearly basis. Doing well. No seizures since 1989.    Dx:  Localization-related epilepsy (HCC)  Developmental delay   PLAN:  SEIZURE DISORDER (stable) - stable; continue CBZ 400mg  twice a day - check CBC, CMP annually per PCP  Return in about 1 year (around 12/21/2018) for with NP.    Suanne Marker, MD 12/20/2017, 2:06 PM Certified in Neurology, Neurophysiology and Neuroimaging  [facility] [address] [place], [place] [address] [phone]

## 2018-05-03 DIAGNOSIS — H40013 Open angle with borderline findings, low risk, bilateral: Secondary | ICD-10-CM | POA: Diagnosis not present

## 2018-05-03 DIAGNOSIS — H5200 Hypermetropia, unspecified eye: Secondary | ICD-10-CM | POA: Diagnosis not present

## 2018-05-03 DIAGNOSIS — H25013 Cortical age-related cataract, bilateral: Secondary | ICD-10-CM | POA: Diagnosis not present

## 2018-05-03 DIAGNOSIS — H35373 Puckering of macula, bilateral: Secondary | ICD-10-CM | POA: Diagnosis not present

## 2018-05-03 DIAGNOSIS — H2513 Age-related nuclear cataract, bilateral: Secondary | ICD-10-CM | POA: Diagnosis not present

## 2018-05-03 LAB — HM DIABETES EYE EXAM

## 2018-05-08 ENCOUNTER — Encounter: Payer: Self-pay | Admitting: Family Medicine

## 2018-11-16 DIAGNOSIS — H40013 Open angle with borderline findings, low risk, bilateral: Secondary | ICD-10-CM | POA: Diagnosis not present

## 2018-12-31 ENCOUNTER — Other Ambulatory Visit: Payer: Self-pay | Admitting: Diagnostic Neuroimaging

## 2019-01-02 ENCOUNTER — Encounter: Payer: Self-pay | Admitting: Family Medicine

## 2019-01-02 ENCOUNTER — Other Ambulatory Visit: Payer: Self-pay

## 2019-01-02 ENCOUNTER — Ambulatory Visit (INDEPENDENT_AMBULATORY_CARE_PROVIDER_SITE_OTHER): Payer: BC Managed Care – PPO | Admitting: Family Medicine

## 2019-01-02 ENCOUNTER — Telehealth: Payer: Self-pay | Admitting: Family Medicine

## 2019-01-02 VITALS — BP 110/65 | HR 67 | Temp 97.2°F | Wt 147.0 lb

## 2019-01-02 DIAGNOSIS — G40109 Localization-related (focal) (partial) symptomatic epilepsy and epileptic syndromes with simple partial seizures, not intractable, without status epilepticus: Secondary | ICD-10-CM | POA: Diagnosis not present

## 2019-01-02 DIAGNOSIS — R6250 Unspecified lack of expected normal physiological development in childhood: Secondary | ICD-10-CM | POA: Diagnosis not present

## 2019-01-02 MED ORDER — CARBAMAZEPINE 200 MG PO TABS: 400.0000 mg | ORAL_TABLET | Freq: Two times a day (BID) | ORAL | 4 refills | Status: DC

## 2019-01-02 NOTE — Progress Notes (Signed)
PATIENT: [name] DOB: 27-Apr-1960  REASON FOR VISIT: follow up HISTORY FROM: patient  Chief Complaint  Patient presents with   Follow-up    Room 2,  epilepsy 1 yr f/u.with Harriett SineNancy and her temp is 97.6     HISTORY OF PRESENT ILLNESS: Today 01/02/19 [name] is a 58 y.o. male here today for follow up for seizures. He continues carbamazepine 400mg  twice daily. He is tolerating medicaitons well with no adverse effects noted. No seizure activity. 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 He lives with his mother and father.     HISTORY: (copied from Dr Richrd HumblesPenumalli's note on 12/20/2017)  UPDATE (12/20/17, VRP): Since last visit, doing well. No seizures. No alleviating or aggravating factors. Tolerating CBZ.    UPDATE 10/04/16: Since last visit, doing well. No seizures. Tolerating CBZ 400mg  twice a day. No other new issues.  UPDATE 06/16/15 (VRP): Since last visit, doing well. No seizures. Doing well on CBZ 400mg  BID. Using goodrx coupon with good results.   UPDATE 06/16/14 (VRP): Since last visit, doing well. No seizures. Doing well on CBZ 400mg  BID.  UPDATE 05/29/13 (VRP): 58 year old male here for followup seizure disorder. Patient is doing well on carbamazepine 400 mg twice a day. He takes vitamin D, calcium, multivitamin daily. No seizure since 1989.  PRIOR HPI (03/12/12, [name]): 58 year old left-handed white single male who lives with his parents and has a history of mental retardation,complex partial, and secondarily generalized seizures followed on a yearly basis. He has not had any seizures since 1989.  He was first seen by Dr. [name] in January,1977 at age 58. He would feel sick and "couldn't talk". This began  in 1976. When he was in school, teachers would notice that he would drop his penciils and paper and make clicking sounds with his mouth. The episodes would last a few seconds. The patient had been on Klonopin for  hyperactivity" and was switched to  phenobarb.  In June 1977 he had 2 days of 3 or 4 seizures. 12/06/1975 he was admitted to [facility] with a  generalized major motor seizure. 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 [name]  and hyperactivity. His developmental  motor milestones were delayed, walking at 14 months,  toilet trained at 6 years, and did not ride a bicycle until 9 or 10 years.He did not pass the third grade and received unsatisfactory grades in grammar school. EEG 9/19/ 77 was normal. He was treated with Cylert for hyperactivity and phenobarbital 80 mg per day.He began having seizures about once every 3 months. These were complex partial seizures. He began Tegretol in 1984 after Depakote, Phenobarbital, and Dilantin were not successful and has had good results with that medication. He denies macropsia, micropsia, dj vu, strange odors or tastes. Last bone density study 04/26/2011 did not show evidence of osteopenia in the left hip but a decrease in bone density in the lumbar spine. Last CBC, CMP, and carbamazepine level 03/21/2011 were normal with a  trough carbamazepine level of 4.6 micrograms per deciliter. A CT scan of the brain with and without contrast enhancement in 1977 was normal. He  is independent in his activities of daily living. He works loading and unloading trucks.His parents have decided that they did not wish him  to drive a car. He has no side effects of medication such as diarrhea,   dizziness, double vision, or drowsiness. Does  complain of numbness in his right hand intermittently without neck pain. This is relieved by shaking his right hand. He has bilateral shoulder pain and possible rotator cuff disease. The  pain is relieved by not working.  He is independent in activities of daily living. He does not drive a car or handle his financial needs. He exercises at  the gym one or 2 times per week.    REVIEW OF SYSTEMS: Out of a complete 14 system review of symptoms, the  patient complains only of the following symptoms, none and all other reviewed systems are negative.  ALLERGIES: No Known Allergies  HOME MEDICATIONS: Outpatient Medications Prior to Visit  Medication Sig Dispense Refill   Cholecalciferol (VITAMIN D3) 1000 UNITS CAPS Take 1 capsule (1,000 Units total) by mouth daily. 30 capsule    fluocinonide gel (LIDEX) 0.05 % APPLY TO AFFECTED AREA 4 TO 6 TIMES DAILY     carbamazepine (TEGRETOL) 200 MG tablet TAKE 2 TABLETS BY MOUTH 2 TIMES DAILY. 360 tablet 4   No facility-administered medications prior to visit.     PAST MEDICAL HISTORY: Past Medical History:  Diagnosis Date   Seizures (HCC)     PAST SURGICAL HISTORY: Past Surgical History:  Procedure Laterality Date   EYE SURGERY Right 1967   ocular muscle surgery, for alignment   HERNIA REPAIR  age 21 and 78   x2,    FAMILY HISTORY: Family History  Problem Relation Age of Onset   Healthy Mother    Hypertension Father    Atrial fibrillation Father    Seizures Neg Hx    Colon cancer Neg Hx    Prostate cancer Neg Hx     SOCIAL HISTORY: Social History   Socioeconomic History   Marital status: Single    Spouse name: Not on file   Number of children: 0   Years of education: HS   Highest education level: Not on file  Occupational History    Employer: [employer]    Comment: [employer]  Social Needs   Financial resource strain: Not on file   Food insecurity    Worry: Not on file    Inability: Not on file   Transportation needs    Medical: Not on file    Non-medical: Not on file  Tobacco Use   Smoking status: Never Smoker   Smokeless tobacco: Never Used  Substance and Sexual Activity   Alcohol use: No   Drug use: No   Sexual activity: Not on file  Lifestyle   Physical activity    Days per week: Not on file    Minutes per session: Not on file   Stress: Not on file  Relationships   Social connections    Talks on phone: Not on file      Gets together: Not on file    Attends religious service: Not on file    Active member of club or organization: Not on file    Attends meetings of clubs or organizations: Not on file    Relationship status: Not on file   Intimate partner violence    Fear of current or ex partner: Not on file    Emotionally abused: Not on file    Physically abused: Not on file    Forced sexual activity: Not on file  Other Topics Concern   Not on file  Social History Narrative   Patient lives at home with his parents.   Caffeine Use: Drinks tea, coffee, and soda  daily.    [place], [place] [employer] fan.     Works in Scientist, research (life sciences).      PHYSICAL EXAM  Vitals:   01/02/19 1348  BP: 110/65  Pulse: 67  Temp: (!) 97.2 F (36.2 C)  Weight: 147 lb (66.7 kg)   Body mass index is 20.79 kg/m.  Generalized: Well developed, in no acute distress  Cardiology: normal rate and rhythm, no murmur noted Neurological examination  Mentation: Alert oriented to time, place, history taking. Follows all commands speech and language fluent Cranial nerve II-XII: Pupils were equal round reactive to light. Extraocular movements were full, visual field were full on confrontational test. Facial sensation and strength were normal. Uvula tongue midline. Head turning and shoulder shrug  were normal and symmetric. Motor: The motor testing reveals 5 over 5 strength of all 4 extremities. Good symmetric motor tone is noted throughout.  Sensory: Sensory testing is intact to soft touch on all 4 extremities. No evidence of extinction is noted.  Coordination: Cerebellar testing reveals good finger-nose-finger and heel-to-shin bilaterally.  Gait and station: Gait is normal.    DIAGNOSTIC DATA (LABS, IMAGING, TESTING) - I reviewed patient records, labs, notes, testing and imaging myself where available.  No flowsheet data found.   Lab Results  Component Value Date   WBC 6.2 10/04/2016   HGB 14.5 10/04/2016   HCT 43.7 10/04/2016    MCV 91 10/04/2016   PLT 222 10/04/2016      Component Value Date/Time   NA 139 10/04/2016 1437   K 4.9 10/04/2016 1437   CL 100 10/04/2016 1437   CO2 25 10/04/2016 1437   GLUCOSE 97 10/04/2016 1437   BUN 16 10/04/2016 1437   CREATININE 0.85 10/04/2016 1437   CALCIUM 9.4 10/04/2016 1437   PROT 7.1 10/04/2016 1437   ALBUMIN 4.7 10/04/2016 1437   AST 14 10/04/2016 1437   ALT 8 10/04/2016 1437   ALKPHOS 71 10/04/2016 1437   BILITOT <0.2 10/04/2016 1437   GFRNONAA 99 10/04/2016 1437   GFRAA 114 10/04/2016 1437   No results found for: CHOL, HDL, LDLCALC, LDLDIRECT, TRIG, CHOLHDL No results found for: HGBA1C No results found for: VITAMINB12 No results found for: TSH     ASSESSMENT AND PLAN 58 y.o. year old male  has a past medical history of Seizures ([facility]). 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 Developmental delay  R62.50      Carson is doing very well.  No seizure activity noted.  We will continue carbamazepine 400mg  twice daily. He will continue follow up with PCP annually for labs. Follow up with [place] in 1 year, sooner if needed. He verbalizes understanding and agreement with plan.   No orders of the defined types were placed in this encounter.    Meds ordered this encounter  Medications   carbamazepine (TEGRETOL) 200 MG tablet    Sig: Take 2 tablets (400 mg total) by mouth 2 (two) times daily.    Dispense:  360 tablet    Refill:  4    Order Specific Question:   Supervising Provider    Answer:   Melvenia Beam [id number]      I spent 15 minutes with the patient. 50% of this time was spent counseling and educating patient on plan of care and medications.    Debbora Presto, FNP-C 01/02/2019, 2:38 PM [facility] [address] [place], [place] [address] [phone]

## 2019-01-02 NOTE — Telephone Encounter (Signed)
Left pt a voicemail to call us and schedule CPE

## 2019-01-02 NOTE — Patient Instructions (Signed)
Continue carbamazepine 400mg  twice daily  Follow up with PCP for annual labs  Follow up with [place] in 1 year  Seizure, Adult A seizure is a sudden burst of abnormal electrical activity in the brain. Seizures usually last from 30 seconds to 2 minutes. They can cause many different symptoms. Usually, seizures are not harmful unless they last a long time. What are the causes? Common causes of this condition include:  Fever or infection.  Conditions that affect the brain, such as: ? A brain abnormality that you were born with. ? A brain or head injury. ? Bleeding in the brain. ? A tumor. ? Stroke. ? Brain disorders such as autism or cerebral palsy.  Low blood sugar.  Conditions that are passed from parent to child (are inherited).  Problems with substances, such as: ? Having a reaction to a drug or a medicine. ? Suddenly stopping the use of a substance (withdrawal). In some cases, the cause may not be known. A person who has repeated seizures over time without a clear cause has a condition called epilepsy. What increases the risk? You are more likely to get this condition if you have:  A family history of epilepsy.  Had a seizure in the past.  A brain disorder.  A history of head injury, lack of oxygen at birth, or strokes. What are the signs or symptoms? There are many types of seizures. The symptoms vary depending on the type of seizure you have. Examples of symptoms during a seizure include:  Shaking (convulsions).  Stiffness in the body.  Passing out (losing consciousness).  Head nodding.  Staring.  Not responding to sound or touch.  Loss of bladder control and bowel control. Some people have symptoms right before and right after a seizure happens. Symptoms before a seizure may include:  Fear.  Worry (anxiety).  Feeling like you may vomit (nauseous).  Feeling like the room is spinning (vertigo).  Feeling like you saw or heard something before (dj vu).   Odd tastes or smells.  Changes in how you see. You may see flashing lights or spots. Symptoms after a seizure happens can include:  Confusion.  Sleepiness.  Headache.  Weakness on one side of the body. How is this treated? Most seizures will stop on their own in under 5 minutes. In these cases, no treatment is needed. Seizures that last longer than 5 minutes will usually need treatment. Treatment can include:  Medicines given through an IV tube.  Avoiding things that are known to cause your seizures. These can include medicines that you take for another condition.  Medicines to treat epilepsy.  Surgery to stop the seizures. This may be needed if medicines do not help. Follow these instructions at home: Medicines  Take over-the-counter and prescription medicines only as told by your doctor.  Do not eat or drink anything that may keep your medicine from working, such as alcohol. Activity  Do not do any activities that would be dangerous if you had another seizure, like driving or swimming. Wait until your doctor says it is safe for you to do them.  If you live in the [place] ask your local DMV (department of motor vehicles) when you can drive.  Get plenty of rest. Teaching others Teach friends and family what to do when you have a seizure. They should:  Lay you on the ground.  Protect your head and body.  Loosen any tight clothing around your neck.  Turn you on your side.  Not hold you down.  Not put anything into your mouth.  Know whether or not you need emergency care.  Stay with you until you are better.  General instructions  Contact your doctor each time you have a seizure.  Avoid anything that gives you seizures.  Keep a seizure diary. Write down: ? What you think caused each seizure. ? What you remember about each seizure.  Keep all follow-up visits as told by your doctor. This is important. Contact a doctor if:  You have another seizure.   You have seizures more often.  There is any change in what happens during your seizures.  You keep having seizures with treatment.  You have symptoms of being sick or having an infection. Get help right away if:  You have a seizure that: ? Lasts longer than 5 minutes. ? Is different than seizures you had before. ? Makes it harder to breathe. ? Happens after you hurt your head.  You have any of these symptoms after a seizure: ? Not being able to speak. ? Not being able to use a part of your body. ? Confusion. ? A bad headache.  You have two or more seizures in a row.  You do not wake up right after a seizure.  You get hurt during a seizure. These symptoms may be an emergency. Do not wait to see if the symptoms will go away. Get medical help right away. Call your local emergency services (911 in the [place] Do not drive yourself to the hospital. Summary  Seizures usually last from 30 seconds to 2 minutes. Usually, they are not harmful unless they last a long time.  Do not eat or drink anything that may keep your medicine from working, such as alcohol.  Teach friends and family what to do when you have a seizure.  Contact your doctor each time you have a seizure. This information is not intended to replace advice given to you by your health care provider. Make sure you discuss any questions you have with your health care provider. Document Released: 08/24/2007 Document Revised: 05/25/2018 Document Reviewed: 05/25/2018 Elsevier Patient Education  2020 [employer].

## 2019-01-02 NOTE — Telephone Encounter (Signed)
Due for f/u CPE when possible.  Thanks.

## 2019-01-14 NOTE — Progress Notes (Signed)
I reviewed note and agree with plan.   VIKRAM R. PENUMALLI, MD 01/14/2019, 11:10 AM Certified in Neurology, Neurophysiology and Neuroimaging  [facility] [address], [address] [place],  [address] [phone]  

## 2019-01-17 NOTE — Telephone Encounter (Signed)
Scheduled labs 03/04/19 with CPE on 03/11/19

## 2019-02-22 ENCOUNTER — Encounter: Payer: BLUE CROSS/BLUE SHIELD | Admitting: Family Medicine

## 2019-03-04 ENCOUNTER — Other Ambulatory Visit: Payer: Self-pay

## 2019-03-04 ENCOUNTER — Other Ambulatory Visit: Payer: Self-pay | Admitting: Family Medicine

## 2019-03-04 ENCOUNTER — Other Ambulatory Visit (INDEPENDENT_AMBULATORY_CARE_PROVIDER_SITE_OTHER): Payer: Self-pay

## 2019-03-04 DIAGNOSIS — Z13220 Encounter for screening for lipoid disorders: Secondary | ICD-10-CM

## 2019-03-04 DIAGNOSIS — G40109 Localization-related (focal) (partial) symptomatic epilepsy and epileptic syndromes with simple partial seizures, not intractable, without status epilepticus: Secondary | ICD-10-CM

## 2019-03-04 LAB — COMPREHENSIVE METABOLIC PANEL
ALT: 8 U/L (ref 0–53)
AST: 14 U/L (ref 0–37)
Albumin: 4.1 g/dL (ref 3.5–5.2)
Alkaline Phosphatase: 77 U/L (ref 39–117)
BUN: 9 mg/dL (ref 6–23)
CO2: 31 mEq/L (ref 19–32)
Calcium: 8.9 mg/dL (ref 8.4–10.5)
Chloride: 103 mEq/L (ref 96–112)
Creatinine, Ser: 0.74 mg/dL (ref 0.40–1.50)
GFR: 109.18 mL/min (ref >60.00)
Glucose, Bld: 100 mg/dL — ABNORMAL HIGH (ref 70–99)
Potassium: 4.6 mEq/L (ref 3.5–5.1)
Sodium: 139 mEq/L (ref 135–145)
Total Bilirubin: 0.4 mg/dL (ref 0.2–1.2)
Total Protein: 6.4 g/dL (ref 6.0–8.3)

## 2019-03-04 LAB — LIPID PANEL
Cholesterol: 170 mg/dL (ref 0–200)
HDL: 50.2 mg/dL (ref >39.00)
LDL Cholesterol: 104 mg/dL — ABNORMAL HIGH (ref 0–99)
NonHDL: 119.51
Total CHOL/HDL Ratio: 3
Triglycerides: 77 mg/dL (ref 0.0–149.0)
VLDL: 15.4 mg/dL (ref 0.0–40.0)

## 2019-03-04 LAB — CBC WITH DIFFERENTIAL/PLATELET
Basophils Absolute: 0 10*3/uL (ref 0.0–0.1)
Basophils Relative: 0.5 % (ref 0.0–3.0)
Eosinophils Absolute: 0.2 10*3/uL (ref 0.0–0.7)
Eosinophils Relative: 3.6 % (ref 0.0–5.0)
HCT: 43.2 % (ref 39.0–52.0)
Hemoglobin: 14.5 g/dL (ref 13.0–17.0)
Lymphocytes Relative: 21.5 % (ref 12.0–46.0)
Lymphs Abs: 1.4 10*3/uL (ref 0.7–4.0)
MCHC: 33.6 g/dL (ref 30.0–36.0)
MCV: 89.9 fl (ref 78.0–100.0)
Monocytes Absolute: 0.7 10*3/uL (ref 0.1–1.0)
Monocytes Relative: 10.4 % (ref 3.0–12.0)
Neutro Abs: 4.2 10*3/uL (ref 1.4–7.7)
Neutrophils Relative %: 64 % (ref 43.0–77.0)
Platelets: 199 10*3/uL (ref 150.0–400.0)
RBC: 4.81 Mil/uL (ref 4.22–5.81)
RDW: 13.9 % (ref 11.5–15.5)
WBC: 6.6 10*3/uL (ref 4.0–10.5)

## 2019-03-11 ENCOUNTER — Encounter: Payer: Self-pay | Admitting: Family Medicine

## 2019-03-11 ENCOUNTER — Ambulatory Visit (INDEPENDENT_AMBULATORY_CARE_PROVIDER_SITE_OTHER): Payer: BC Managed Care – PPO | Admitting: Family Medicine

## 2019-03-11 ENCOUNTER — Other Ambulatory Visit: Payer: Self-pay

## 2019-03-11 VITALS — BP 102/60 | HR 73 | Temp 97.5°F | Ht 70.5 in | Wt 143.6 lb

## 2019-03-11 DIAGNOSIS — Z23 Encounter for immunization: Secondary | ICD-10-CM

## 2019-03-11 DIAGNOSIS — G40109 Localization-related (focal) (partial) symptomatic epilepsy and epileptic syndromes with simple partial seizures, not intractable, without status epilepticus: Secondary | ICD-10-CM

## 2019-03-11 DIAGNOSIS — Z0000 Encounter for general adult medical examination without abnormal findings: Secondary | ICD-10-CM | POA: Diagnosis not present

## 2019-03-11 DIAGNOSIS — Z1211 Encounter for screening for malignant neoplasm of colon: Secondary | ICD-10-CM

## 2019-03-11 DIAGNOSIS — Z7189 Other specified counseling: Secondary | ICD-10-CM

## 2019-03-11 NOTE — Patient Instructions (Addendum)
Tetanus shot today.   Check with your insurance to see if they will cover the shingles shot. Update me as needed.  Take care.  Glad to see you.

## 2019-03-11 NOTE — Progress Notes (Signed)
This visit occurred during the SARS-CoV-2 public health emergency.  Safety protocols were in place, including screening questions prior to the visit, additional usage of staff PPE, and extensive cleaning of exam room while observing appropriate contact time as indicated for disinfecting solutions.  CPE- See plan.  Routine anticipatory guidance given to patient.  See health maintenance.  The possibility exists that previously documented standard health maintenance information may have been brought forward from a previous encounter into this note.  If needed, that same information has been updated to reflect the current situation based on today's encounter.    Tetanus 2020 PNA not due Shingles d/w pt.  See avs.   Flu shot 2020.   Living will d/w pt.  Parents designated if patient were incapacitated.  Then sister Gloris Ham designated if needed, if parents were incapacitated.  D/w patient [id number] for colon cancer screening, including IFOB vs. colonoscopy.  Risks and benefits of both were discussed and patient voiced understanding.  Pt elects MVH:QION.  Prostate cancer screening and PSA options (with potential risks and benefits of testing vs not testing) were discussed along with recent recs/guidelines.  He declined testing PSA at this point. Diet and exercise d/w pt.  Doing well on both.  Physical job.   No SZ in the meantime.  Compliant.  No events in years.  Had neuro f/u.  Labs d/w pt.    PMH and SH reviewed Meds, vitals, and allergies reviewed.   ROS: Per HPI.  Unless specifically indicated otherwise in HPI, the patient denies:  General: fever. Eyes: acute vision changes ENT: sore throat Cardiovascular: chest pain Respiratory: SOB GI: vomiting GU: dysuria Musculoskeletal: acute back pain Derm: acute rash Neuro: acute motor dysfunction Psych: worsening mood Endocrine: polydipsia Heme: bleeding Allergy: hayfever  GEN: nad, alert and oriented HEENT: ncat NECK: supple w/o  LA CV: rrr. PULM: ctab, no inc wob ABD: soft, +bs EXT: no edema SKIN: no acute rash

## 2019-03-13 NOTE — Assessment & Plan Note (Signed)
No SZ in the meantime.  Compliant.  No events in years.  Had neuro f/u.  Labs d/w pt.   continue as is.  I will defer otherwise.  He agrees.

## 2019-03-13 NOTE — Assessment & Plan Note (Signed)
Living will d/w pt.  Parents designated if patient were incapacitated.  Then sister [name] designated if needed, if parents were incapacitated. ?

## 2019-03-13 NOTE — Assessment & Plan Note (Signed)
Tetanus 2020 PNA not due Shingles d/w pt.  See avs.   Flu shot 2020.   Living will d/w pt.  Parents designated if patient were incapacitated.  Then sister Gloris Ham designated if needed, if parents were incapacitated.  D/w patient [id number] for colon cancer screening, including IFOB vs. colonoscopy.  Risks and benefits of both were discussed and patient voiced understanding.  Pt elects NOI:BBCW.  Prostate cancer screening and PSA options (with potential risks and benefits of testing vs not testing) were discussed along with recent recs/guidelines.  He declined testing PSA at this point. Diet and exercise d/w pt.  Doing well on both.  Physical job.

## 2019-05-16 DIAGNOSIS — H25013 Cortical age-related cataract, bilateral: Secondary | ICD-10-CM | POA: Diagnosis not present

## 2019-05-16 DIAGNOSIS — H2513 Age-related nuclear cataract, bilateral: Secondary | ICD-10-CM | POA: Diagnosis not present

## 2019-05-16 DIAGNOSIS — H40013 Open angle with borderline findings, low risk, bilateral: Secondary | ICD-10-CM | POA: Diagnosis not present

## 2019-05-16 LAB — HM DIABETES EYE EXAM

## 2019-06-03 ENCOUNTER — Telehealth: Payer: Self-pay | Admitting: Family Medicine

## 2019-06-03 NOTE — Telephone Encounter (Signed)
Herrera,Nancy(mother on DPR) has called re: pt's carbamazepine (TEGRETOL) 200 MG tablet she wants to know if there are any concerns with him getting the Covid-19 vaccine as a result of taking this medication.  Please call

## 2019-06-03 NOTE — Telephone Encounter (Signed)
I called and spoke to mother of pt.  She is checking if ok to have covid vaccine.  I relayed that yes we are recommending pt to have the vaccine.  We see him for seizures.

## 2019-11-13 DIAGNOSIS — H40013 Open angle with borderline findings, low risk, bilateral: Secondary | ICD-10-CM | POA: Diagnosis not present

## 2020-01-06 ENCOUNTER — Ambulatory Visit (INDEPENDENT_AMBULATORY_CARE_PROVIDER_SITE_OTHER): Payer: BC Managed Care – PPO | Admitting: Family Medicine

## 2020-01-06 ENCOUNTER — Encounter: Payer: Self-pay | Admitting: Family Medicine

## 2020-01-06 VITALS — BP 109/57 | HR 70 | Ht 70.5 in | Wt 145.4 lb

## 2020-01-06 DIAGNOSIS — G40109 Localization-related (focal) (partial) symptomatic epilepsy and epileptic syndromes with simple partial seizures, not intractable, without status epilepticus: Secondary | ICD-10-CM

## 2020-01-06 MED ORDER — CARBAMAZEPINE 200 MG PO TABS: 400.0000 mg | ORAL_TABLET | Freq: Two times a day (BID) | ORAL | 4 refills | Status: DC

## 2020-01-06 NOTE — Patient Instructions (Addendum)
We will continue carbamazepine 400mg  twice daily.   We will update labs today  Stay well hydrated. Well balanced diet and regular activity. Follow up with Dr annually.   Follow up in 1 year    Seizure, Adult A seizure is a sudden burst of abnormal electrical activity in the brain. Seizures usually last from 30 seconds to 2 minutes. They can cause many different symptoms. Usually, seizures are not harmful unless they last a long time. What are the causes? Common causes of this condition include:  Fever or infection.  Conditions that affect the brain, such as: ? A brain abnormality that you were born with. ? A brain or head injury. ? Bleeding in the brain. ? A tumor. ? Stroke. ? Brain disorders such as autism or cerebral palsy.  Low blood sugar.  Conditions that are passed from parent to child (are inherited).  Problems with substances, such as: ? Having a reaction to a drug or a medicine. ? Suddenly stopping the use of a substance (withdrawal). In some cases, the cause may not be known. A person who has repeated seizures over time without a clear cause has a condition called epilepsy. What increases the risk? You are more likely to get this condition if you have:  A family history of epilepsy.  Had a seizure in the past.  A brain disorder.  A history of head injury, lack of oxygen at birth, or strokes. What are the signs or symptoms? There are many types of seizures. The symptoms vary depending on the type of seizure you have. Examples of symptoms during a seizure include:  Shaking (convulsions).  Stiffness in the body.  Passing out (losing consciousness).  Head nodding.  Staring.  Not responding to sound or touch.  Loss of bladder control and bowel control. Some people have symptoms right before and right after a seizure happens. Symptoms before a seizure may include:  Fear.  Worry (anxiety).  Feeling like you may vomit (nauseous).  Feeling  like the room is spinning (vertigo).  Feeling like you saw or heard something before (dj vu).  Odd tastes or smells.  Changes in how you see. You may see flashing lights or spots. Symptoms after a seizure happens can include:  Confusion.  Sleepiness.  Headache.  Weakness on one side of the body. How is this treated? Most seizures will stop on their own in under 5 minutes. In these cases, no treatment is needed. Seizures that last longer than 5 minutes will usually need treatment. Treatment can include:  Medicines given through an IV tube.  Avoiding things that are known to cause your seizures. These can include medicines that you take for another condition.  Medicines to treat epilepsy.  Surgery to stop the seizures. This may be needed if medicines do not help. Follow these instructions at home: Medicines  Take over-the-counter and prescription medicines only as told by your doctor.  Do not eat or drink anything that may keep your medicine from working, such as alcohol. Activity  Do not do any activities that would be dangerous if you had another seizure, like driving or swimming. Wait until your doctor says it is safe for you to do them.  If you live in the [place] ask your local DMV (department of motor vehicles) when you can drive.  Get plenty of rest. Teaching others Teach friends and family what to do when you have a seizure. They should:  Lay you on the ground.  Protect your  head and body.  Loosen any tight clothing around your neck.  Turn you on your side.  Not hold you down.  Not put anything into your mouth.  Know whether or not you need emergency care.  Stay with you until you are better.  General instructions  Contact your doctor each time you have a seizure.  Avoid anything that gives you seizures.  Keep a seizure diary. Write down: ? What you think caused each seizure. ? What you remember about each seizure.  Keep all follow-up visits  as told by your doctor. This is important. Contact a doctor if:  You have another seizure.  You have seizures more often.  There is any change in what happens during your seizures.  You keep having seizures with treatment.  You have symptoms of being sick or having an infection. Get help right away if:  You have a seizure that: ? Lasts longer than 5 minutes. ? Is different than seizures you had before. ? Makes it harder to breathe. ? Happens after you hurt your head.  You have any of these symptoms after a seizure: ? Not being able to speak. ? Not being able to use a part of your body. ? Confusion. ? A bad headache.  You have two or more seizures in a row.  You do not wake up right after a seizure.  You get hurt during a seizure. These symptoms may be an emergency. Do not wait to see if the symptoms will go away. Get medical help right away. Call your local emergency services (911 in the [place] Do not drive yourself to the hospital. Summary  Seizures usually last from 30 seconds to 2 minutes. Usually, they are not harmful unless they last a long time.  Do not eat or drink anything that may keep your medicine from working, such as alcohol.  Teach friends and family what to do when you have a seizure.  Contact your doctor each time you have a seizure. This information is not intended to replace advice given to you by your health care provider. Make sure you discuss any questions you have with your health care provider. Document Revised: 05/25/2018 Document Reviewed: 05/25/2018 Elsevier Patient Education  2020 [employer].

## 2020-01-06 NOTE — Progress Notes (Signed)
Chief Complaint  Patient presents with  . Follow-up    Seizures. Treatment Rm.  No seizures.  Mother.      HISTORY OF PRESENT ILLNESS: Today 01/06/20  [name] is a 59 y.o. male here today for follow up for seizures.  He continues carbamazepine 400 mg twice daily.  He is doing very well and tolerating medication.  No recent seizure activity.  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  He was last seen by primary care and 02/2019.  He has not had recent lab work.   HISTORY (copied from my note on 01/02/2019)  [name] is a 59 y.o. male here today for follow up for seizures. He continues carbamazepine 400mg  twice daily. He is tolerating medicaitons well with no adverse effects noted. No seizure activity. 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 He lives with his mother and father.     HISTORY: (copied from Dr Richrd HumblesPenumalli's note on 12/20/2017)  UPDATE (12/20/17, [facility]P): Since last visit, doingwell.No seizures. No alleviating or aggravating factors. ToleratingCBZ.  UPDATE 10/04/16: Since last visit, doing well. No seizures. Tolerating CBZ 400mg  twice a day. No other new issues.  UPDATE 06/16/15 ([facility]P): Since last visit, doing well. No seizures. Doing well on CBZ 400mg  BID. Using goodrx coupon with good results.   UPDATE 06/16/14 ([facility]P): Since last visit, doing well. No seizures. Doing well on CBZ 400mg  BID.  UPDATE 05/29/13 (VRP): 59 year old male here for followup seizure disorder. Patient is doing well on carbamazepine 400 mg twice a day. He takes vitamin D, calcium, multivitamin daily. No seizure since 1989.  PRIOR HPI (03/12/12, [name]): 59 year old left-handed white single male who lives with his parents and has a history of mental retardation,complex partial, and secondarily generalized seizures followed on a yearly basis. He has not had any seizures since 1989. He was first seen by Dr. [name] in January,1977 at age 59. He would feel  sick and "couldn't talk". This began in 1976. When he was in school, teachers would notice that he would drop his penciils and paper and make clicking sounds with his mouth. The episodes would last a few seconds. The patient had been on Klonopin for hyperactivity" and was switched to phenobarb. In June 1977 he had 2 days of 3 or 4 seizures. 12/06/1975 he was admitted to [facility] with a generalized major motor seizure. 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 [name] and hyperactivity. His developmental motor milestones were delayed, walking at 14 months, toilet trained at 6 years, and did not ride a bicycle until 9 or 10 years.He did not pass the third grade and received unsatisfactory grades in grammar school. EEG 9/19/ 77 was normal. He was treated with Cylert for hyperactivity and phenobarbital 80 mg per day.He began having seizures about once every 3 months. These were complex partial seizures. He began Tegretol in 1984 after Depakote, Phenobarbital, and Dilantin were not successful and has had good results with that medication. He denies macropsia, micropsia, dj vu, strange odors or tastes. Last bone density study 04/26/2011 did not show evidence of osteopenia in the left hip but a decrease in bone density in the lumbar spine. Last CBC, CMP, and carbamazepine level 03/21/2011 were normal with a trough carbamazepine level of 4.6 micrograms per deciliter. A CT scan of the brain with and without contrast enhancement in 1977 was normal. He is independent in his activities  of daily living. He works loading and unloading trucks.His parents have decided that they did not wish him to drive a car. He has no side effects of medication such as diarrhea, dizziness, double vision, or drowsiness. Does complain of numbness in his right hand intermittently without neck pain. This is relieved by shaking his right hand. He has bilateral shoulder pain and possible rotator  cuff disease. The pain is relieved by not working. He is independent in activities of daily living. He does not drive a car or handle his financial needs. He exercises at the gym one or 2 times per week.     REVIEW OF SYSTEMS: Out of a complete 14 system review of symptoms, the patient complains only of the following symptoms, none and all other reviewed systems are negative.   ALLERGIES: No Known Allergies   HOME MEDICATIONS: Outpatient Medications Prior to Visit  Medication Sig Dispense Refill  . Cholecalciferol (VITAMIN D3) 1000 UNITS CAPS Take 1 capsule (1,000 Units total) by mouth daily. 30 capsule   . carbamazepine (TEGRETOL) 200 MG tablet Take 2 tablets (400 mg total) by mouth 2 (two) times daily. 360 tablet 4   No facility-administered medications prior to visit.     PAST MEDICAL HISTORY: Past Medical History:  Diagnosis Date  . Seizures (HCC)      PAST SURGICAL HISTORY: Past Surgical History:  Procedure Laterality Date  . EYE SURGERY Right 1967   ocular muscle surgery, for alignment  . HERNIA REPAIR  age 53 and 69   x2,     FAMILY HISTORY: Family History  Problem Relation Age of Onset  . Healthy Mother   . Hypertension Father   . Atrial fibrillation Father   . Seizures Neg Hx   . Colon cancer Neg Hx   . Prostate cancer Neg Hx      SOCIAL HISTORY: Social History   Socioeconomic History  . Marital status: Single    Spouse name: Not on file  . Number of children: 0  . Years of education: HS  . Highest education level: Not on file  Occupational History    Employer: [employer]    Comment: [employer]  Tobacco Use  . Smoking status: Never Smoker  . Smokeless tobacco: Never Used  Substance and Sexual Activity  . Alcohol use: No  . Drug use: No  . Sexual activity: Not on file  Other Topics Concern  . Not on file  Social History Narrative   Patient lives at home with his parents.   Caffeine Use: Drinks tea, coffee, and soda daily.     [place], [place] [employer] fan.     Works in Teaching laboratory technician.   Social Determinants of Health   Financial Resource Strain:   . Difficulty of Paying Living Expenses: Not on file  Food Insecurity:   . Worried About Programme researcher, broadcasting/film/video in the Last Year: Not on file  . Ran Out of Food in the Last Year: Not on file  Transportation Needs:   . Lack of Transportation (Medical): Not on file  . Lack of Transportation (Non-Medical): Not on file  Physical Activity:   . Days of Exercise per Week: Not on file  . Minutes of Exercise per Session: Not on file  Stress:   . Feeling of Stress : Not on file  Social Connections:   . Frequency of Communication with Friends and Family: Not on file  . Frequency of Social Gatherings with Friends and Family: Not on file  .  Attends Religious Services: Not on file  . Active Member of Clubs or Organizations: Not on file  . Attends Banker Meetings: Not on file  . Marital Status: Not on file  Intimate Partner Violence:   . Fear of Current or Ex-Partner: Not on file  . Emotionally Abused: Not on file  . Physically Abused: Not on file  . Sexually Abused: Not on file      PHYSICAL EXAM  Vitals:   01/06/20 1335  BP: (!) 109/57  Pulse: 70  Weight: 145 lb 6.4 oz (66 kg)  Height: 5' 10.5" (1.791 m)   Body mass index is 20.57 kg/m.   Generalized: Well developed, in no acute distress   Neurological examination  Mentation: Alert, developmental delay, oriented to time, place, history taking. Follows all commands speech and language fluent Cranial nerve II-XII: Pupils were equal round reactive to light. Extraocular movements were full, visual field were full on confrontational test. Facial sensation and strength were normal. Uvula tongue midline. Head turning and shoulder shrug  were normal and symmetric. Motor: The motor testing reveals 5 over 5 strength of all 4 extremities. Good symmetric motor tone is noted throughout.  Sensory: Sensory testing is  intact to soft touch on all 4 extremities. No evidence of extinction is noted.  Coordination: Cerebellar testing reveals good finger-nose-finger and heel-to-shin bilaterally.  Gait and station: Gait is normal.  Reflexes: Deep tendon reflexes are symmetric and normal bilaterally.     DIAGNOSTIC DATA (LABS, IMAGING, TESTING) - I reviewed patient records, labs, notes, testing and imaging myself where available.  Lab Results  Component Value Date   WBC 6.6 03/04/2019   HGB 14.5 03/04/2019   HCT 43.2 03/04/2019   MCV 89.9 03/04/2019   PLT 199.0 03/04/2019      Component Value Date/Time   NA 139 03/04/2019 1001   NA 139 10/04/2016 1437   K 4.6 03/04/2019 1001   CL 103 03/04/2019 1001   CO2 31 03/04/2019 1001   GLUCOSE 100 (H) 03/04/2019 1001   BUN 9 03/04/2019 1001   BUN 16 10/04/2016 1437   CREATININE 0.74 03/04/2019 1001   CALCIUM 8.9 03/04/2019 1001   PROT 6.4 03/04/2019 1001   PROT 7.1 10/04/2016 1437   ALBUMIN 4.1 03/04/2019 1001   ALBUMIN 4.7 10/04/2016 1437   AST 14 03/04/2019 1001   ALT 8 03/04/2019 1001   ALKPHOS 77 03/04/2019 1001   BILITOT 0.4 03/04/2019 1001   BILITOT <0.2 10/04/2016 1437   GFRNONAA 99 10/04/2016 1437   GFRAA 114 10/04/2016 1437   Lab Results  Component Value Date   CHOL 170 03/04/2019   HDL 50.20 03/04/2019   LDLCALC 104 (H) 03/04/2019   TRIG 77.0 03/04/2019   CHOLHDL 3 03/04/2019   No results found for: HGBA1C No results found for: VITAMINB12 No results found for: TSH    ASSESSMENT AND PLAN  59 y.o. year old male  has a past medical history of Seizures (HCC). here with   Localization-related epilepsy (HCC) - Plan: CBC with Differential/Platelets, CMP, Carbamazepine level, total   Halim is doing very well.  We will continue carbamazepine 400 mg twice daily.  I will update labs today.  I have encouraged him to continue healthy lifestyle habits.  He will follow-up with primary care annually as directed.  He will follow-up with me  in 1 year, sooner if needed.  He verbalizes understanding and agreement with this plan.  I spent 20 minutes of face-to-face and non-face-to-face  time with patient.  This included previsit chart review, lab review, study review, order entry, electronic health record documentation, patient education.    Shawnie Dapper, MSN, FNP-C 01/06/2020, 4:24 PM  [facility] [address] [place], [place] [address] [phone]

## 2020-01-07 ENCOUNTER — Telehealth: Payer: Self-pay

## 2020-01-07 LAB — COMPREHENSIVE METABOLIC PANEL
ALT: 11 IU/L (ref 0–44)
AST: 17 IU/L (ref 0–40)
Albumin/Globulin Ratio: 1.9 (ref 1.2–2.2)
Albumin: 4.4 g/dL (ref 3.8–4.9)
Alkaline Phosphatase: 88 IU/L (ref 44–121)
BUN/Creatinine Ratio: 15 (ref 9–20)
BUN: 11 mg/dL (ref 6–24)
Bilirubin Total: <0.2 mg/dL (ref 0.0–1.2)
CO2: 25 mmol/L (ref 20–29)
Calcium: 9.3 mg/dL (ref 8.7–10.2)
Chloride: 102 mmol/L (ref 96–106)
Creatinine, Ser: 0.73 mg/dL — ABNORMAL LOW (ref 0.76–1.27)
GFR calc Af Amer: 119 mL/min/{1.73_m2} (ref >59)
GFR calc non Af Amer: 103 mL/min/{1.73_m2} (ref >59)
Globulin, Total: 2.3 g/dL (ref 1.5–4.5)
Glucose: 96 mg/dL (ref 65–99)
Potassium: 4 mmol/L (ref 3.5–5.2)
Sodium: 139 mmol/L (ref 134–144)
Total Protein: 6.7 g/dL (ref 6.0–8.5)

## 2020-01-07 LAB — CBC WITH DIFFERENTIAL/PLATELET
Basophils Absolute: 0 10*3/uL (ref 0.0–0.2)
Basos: 0 %
EOS (ABSOLUTE): 0.2 10*3/uL (ref 0.0–0.4)
Eos: 4 %
Hematocrit: 39.4 % (ref 37.5–51.0)
Hemoglobin: 13.9 g/dL (ref 13.0–17.7)
Immature Grans (Abs): 0 10*3/uL (ref 0.0–0.1)
Immature Granulocytes: 0 %
Lymphocytes Absolute: 1.3 10*3/uL (ref 0.7–3.1)
Lymphs: 25 %
MCH: 31.4 pg (ref 26.6–33.0)
MCHC: 35.3 g/dL (ref 31.5–35.7)
MCV: 89 fL (ref 79–97)
Monocytes Absolute: 0.6 10*3/uL (ref 0.1–0.9)
Monocytes: 12 %
Neutrophils Absolute: 3.2 10*3/uL (ref 1.4–7.0)
Neutrophils: 59 %
Platelets: 208 10*3/uL (ref 150–450)
RBC: 4.43 x10E6/uL (ref 4.14–5.80)
RDW: 13 % (ref 11.6–15.4)
WBC: 5.4 10*3/uL (ref 3.4–10.8)

## 2020-01-07 LAB — CARBAMAZEPINE LEVEL, TOTAL: Carbamazepine (Tegretol), S: 9.2 ug/mL (ref 4.0–12.0)

## 2020-01-07 NOTE — Telephone Encounter (Signed)
-----   Message from Shawnie Dapper, NP sent at 01/07/2020  9:08 AM EDT ----- Please let his mother know that labs look great. TY!

## 2020-01-07 NOTE — Telephone Encounter (Signed)
Pt's mother called back and the results were given. No questions asked.  This is Financial planner

## 2020-01-07 NOTE — Telephone Encounter (Signed)
Attempted to call the patient without success.  LM on the VM for the patient to call back re: recent results.  **If the patient calls back please relay the message below from Jessica McCue NP 

## 2020-01-13 NOTE — Progress Notes (Signed)
I reviewed note and agree with plan.   Suanne Marker, MD 01/13/2020, 5:00 PM Certified in Neurology, Neurophysiology and Neuroimaging  [facility] [address] [place], [place] [address] [phone]

## 2020-03-08 ENCOUNTER — Other Ambulatory Visit: Payer: Self-pay | Admitting: Family Medicine

## 2020-03-08 DIAGNOSIS — E785 Hyperlipidemia, unspecified: Secondary | ICD-10-CM

## 2020-03-19 ENCOUNTER — Other Ambulatory Visit: Payer: BC Managed Care – PPO

## 2020-03-26 ENCOUNTER — Encounter: Payer: BC Managed Care – PPO | Admitting: Family Medicine

## 2020-04-24 ENCOUNTER — Other Ambulatory Visit: Payer: BC Managed Care – PPO

## 2020-05-01 ENCOUNTER — Encounter: Payer: BC Managed Care – PPO | Admitting: Family Medicine

## 2020-06-25 DIAGNOSIS — H2513 Age-related nuclear cataract, bilateral: Secondary | ICD-10-CM | POA: Diagnosis not present

## 2020-06-25 DIAGNOSIS — H40013 Open angle with borderline findings, low risk, bilateral: Secondary | ICD-10-CM | POA: Diagnosis not present

## 2020-06-25 DIAGNOSIS — H25013 Cortical age-related cataract, bilateral: Secondary | ICD-10-CM | POA: Diagnosis not present

## 2020-10-27 ENCOUNTER — Ambulatory Visit (INDEPENDENT_AMBULATORY_CARE_PROVIDER_SITE_OTHER): Payer: BC Managed Care – PPO | Admitting: Family Medicine

## 2020-10-27 ENCOUNTER — Encounter: Payer: Self-pay | Admitting: Family Medicine

## 2020-10-27 ENCOUNTER — Other Ambulatory Visit: Payer: Self-pay

## 2020-10-27 VITALS — BP 116/70 | HR 62 | Temp 96.9°F | Ht 71.0 in | Wt 139.0 lb

## 2020-10-27 DIAGNOSIS — Z0000 Encounter for general adult medical examination without abnormal findings: Secondary | ICD-10-CM

## 2020-10-27 DIAGNOSIS — Z1211 Encounter for screening for malignant neoplasm of colon: Secondary | ICD-10-CM | POA: Diagnosis not present

## 2020-10-27 DIAGNOSIS — R634 Abnormal weight loss: Secondary | ICD-10-CM | POA: Diagnosis not present

## 2020-10-27 DIAGNOSIS — E785 Hyperlipidemia, unspecified: Secondary | ICD-10-CM

## 2020-10-27 DIAGNOSIS — G40909 Epilepsy, unspecified, not intractable, without status epilepticus: Secondary | ICD-10-CM

## 2020-10-27 DIAGNOSIS — G40109 Localization-related (focal) (partial) symptomatic epilepsy and epileptic syndromes with simple partial seizures, not intractable, without status epilepticus: Secondary | ICD-10-CM

## 2020-10-27 DIAGNOSIS — Z7189 Other specified counseling: Secondary | ICD-10-CM

## 2020-10-27 DIAGNOSIS — Z029 Encounter for administrative examinations, unspecified: Secondary | ICD-10-CM | POA: Diagnosis not present

## 2020-10-27 LAB — COMPREHENSIVE METABOLIC PANEL
ALT: 8 U/L (ref 0–53)
AST: 16 U/L (ref 0–37)
Albumin: 4.3 g/dL (ref 3.5–5.2)
Alkaline Phosphatase: 78 U/L (ref 39–117)
BUN: 14 mg/dL (ref 6–23)
CO2: 30 mEq/L (ref 19–32)
Calcium: 9.1 mg/dL (ref 8.4–10.5)
Chloride: 101 mEq/L (ref 96–112)
Creatinine, Ser: 0.79 mg/dL (ref 0.40–1.50)
GFR: 98.11 mL/min (ref >60.00)
Glucose, Bld: 87 mg/dL (ref 70–99)
Potassium: 5 mEq/L (ref 3.5–5.1)
Sodium: 137 mEq/L (ref 135–145)
Total Bilirubin: 0.3 mg/dL (ref 0.2–1.2)
Total Protein: 6.9 g/dL (ref 6.0–8.3)

## 2020-10-27 LAB — CBC WITH DIFFERENTIAL/PLATELET
Basophils Absolute: 0 10*3/uL (ref 0.0–0.1)
Basophils Relative: 0.5 % (ref 0.0–3.0)
Eosinophils Absolute: 0.1 10*3/uL (ref 0.0–0.7)
Eosinophils Relative: 2.4 % (ref 0.0–5.0)
HCT: 42.1 % (ref 39.0–52.0)
Hemoglobin: 13.8 g/dL (ref 13.0–17.0)
Lymphocytes Relative: 25.6 % (ref 12.0–46.0)
Lymphs Abs: 1.4 10*3/uL (ref 0.7–4.0)
MCHC: 32.8 g/dL (ref 30.0–36.0)
MCV: 90.9 fl (ref 78.0–100.0)
Monocytes Absolute: 0.6 10*3/uL (ref 0.1–1.0)
Monocytes Relative: 11.4 % (ref 3.0–12.0)
Neutro Abs: 3.4 10*3/uL (ref 1.4–7.7)
Neutrophils Relative %: 60.1 % (ref 43.0–77.0)
Platelets: 230 10*3/uL (ref 150.0–400.0)
RBC: 4.64 Mil/uL (ref 4.22–5.81)
RDW: 14.2 % (ref 11.5–15.5)
WBC: 5.6 10*3/uL (ref 4.0–10.5)

## 2020-10-27 LAB — LIPID PANEL
Cholesterol: 163 mg/dL (ref 0–200)
HDL: 50.8 mg/dL (ref >39.00)
LDL Cholesterol: 95 mg/dL (ref 0–99)
NonHDL: 112
Total CHOL/HDL Ratio: 3
Triglycerides: 86 mg/dL (ref 0.0–149.0)
VLDL: 17.2 mg/dL (ref 0.0–40.0)

## 2020-10-27 LAB — TSH: TSH: 1.36 u[IU]/mL (ref 0.35–5.50)

## 2020-10-27 NOTE — Progress Notes (Signed)
This visit occurred during the SARS-CoV-2 public health emergency.  Safety protocols were in place, including screening questions prior to the visit, additional usage of staff PPE, and extensive cleaning of exam room while observing appropriate contact time as indicated for disinfecting solutions.  CPE- See plan.  Routine anticipatory guidance given to patient.  See health maintenance.  The possibility exists that previously documented standard health maintenance information may have been brought forward from a previous encounter into this note.  If needed, that same information has been updated to reflect the current situation based on today's encounter.    History of seizure disorder.  Compliant with carbamazepine.  No events in the meantime.  No adverse effect on medication.    D/w patient [id number] for colon cancer screening, including IFOB vs. colonoscopy.  Risks and benefits of both were discussed and patient voiced understanding.  Pt elects for cologuard.   Tetanus shot 2020. Flu shot encouraged for the fall. COVID shot previously done. Shingles shot pneumonia shot discussed with patient. Prostate cancer screening and PSA options (with potential risks and benefits of testing vs not testing) were discussed along with recent recs/guidelines.  He declined testing PSA at this point. Living will d/w pt.  Parents designated if patient were incapacitated.  Then sister Randel Books designated if needed, if parents were incapacitated.  He needs a form filled out for work.  Biometric screening.  See notes on labs/form.  Weight loss noted.  Diet has been affected by family events.  His father has been ill and that caused a change the meal preparation/routine at home.  He does not have fevers or chills or obvious symptoms that would explain the weight loss otherwise.  He is not having night sweats or hemoptysis.  Not passing blood.  Follow-up labs pending.  He does have a physically demanding job.  PMH  and SH reviewed  Meds, vitals, and allergies reviewed.   ROS: Per HPI.  Unless specifically indicated otherwise in HPI, the patient denies:  General: fever. Eyes: acute vision changes ENT: sore throat Cardiovascular: chest pain Respiratory: SOB GI: vomiting GU: dysuria Musculoskeletal: acute back pain Derm: acute rash Neuro: acute motor dysfunction Psych: worsening mood Endocrine: polydipsia Heme: bleeding Allergy: hayfever  GEN: nad, alert and oriented HEENT: ncat NECK: supple w/o LA CV: rrr. PULM: ctab, no inc wob ABD: soft, +bs EXT: no edema SKIN: no acute rash

## 2020-10-27 NOTE — Patient Instructions (Signed)
Go to the lab on the way out.   If you have mychart we'll likely use that to update you.    I'll work on your forms.  We'll work on getting cologuard sent to you.  Take care.  Glad to see you.

## 2020-10-28 DIAGNOSIS — R634 Abnormal weight loss: Secondary | ICD-10-CM | POA: Insufficient documentation

## 2020-10-28 NOTE — Assessment & Plan Note (Signed)
I do not suspect an ominous diagnosis, especially given the change in meal prep at home.  See notes on labs.  His father's situation is improved and that may improve meal prep going forward.

## 2020-10-28 NOTE — Assessment & Plan Note (Signed)
D/w patient [id number] for colon cancer screening, including IFOB vs. colonoscopy.  Risks and benefits of both were discussed and patient voiced understanding.  Pt elects for cologuard.   Tetanus shot 2020. Flu shot encouraged for the fall. COVID shot previously done. Shingles shot pneumonia shot discussed with patient. Prostate cancer screening and PSA options (with potential risks and benefits of testing vs not testing) were discussed along with recent recs/guidelines.  He declined testing PSA at this point. Living will d/w pt.  Parents designated if patient were incapacitated.  Then sister Randel Books designated if needed, if parents were incapacitated.

## 2020-10-28 NOTE — Assessment & Plan Note (Signed)
Living will d/w pt.  Parents designated if patient were incapacitated.  Then sister [name] designated if needed, if parents were incapacitated. ?

## 2020-10-28 NOTE — Assessment & Plan Note (Signed)
Continue carbamazepine.  Check drug level today since he will be having other labs drawn.  We will follow-up with neurology.  No events.

## 2020-10-29 LAB — EXTRA SPECIMEN

## 2020-10-29 LAB — CARBAMAZEPINE LEVEL, TOTAL

## 2020-10-29 NOTE — Addendum Note (Signed)
Addended by: Alvina Chou on: 10/29/2020 02:17 PM   Modules accepted: Orders

## 2020-10-29 NOTE — Addendum Note (Signed)
Addended by: Alvina Chou on: 10/29/2020 02:20 PM   Modules accepted: Orders

## 2020-10-30 LAB — CARBAMAZEPINE LEVEL, TOTAL: Carbamazepine Lvl: 8.8 mg/L (ref 4.0–12.0)

## 2021-01-11 ENCOUNTER — Ambulatory Visit: Payer: BC Managed Care – PPO | Admitting: Family Medicine

## 2021-01-11 NOTE — Progress Notes (Deleted)
No chief complaint on file.   HISTORY OF PRESENT ILLNESS: 01/11/21 ALL: [name] returns for follow up for seizures. He continues carbamazepine 400mg  BID and is tolerating well. Labs were normal with CPE 10/2020.   01/06/2020 ALL:  [name] is a 60 y.o. male here today for follow up for seizures.  He continues carbamazepine 400 mg twice daily.  He is doing very well and tolerating medication.  No recent seizure activity.  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  He was last seen by primary care and 02/2019.  He has not had recent lab work.  HISTORY (copied from my note on 01/02/2019)  [name] is a 60 y.o. male here today for follow up for seizures. He continues carbamazepine 400mg  twice daily. He is tolerating medicaitons well with no adverse effects noted. No seizure activity. 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 He lives with his mother and father.      HISTORY: (copied from Dr 59 note on 12/20/2017)   UPDATE (12/20/17, VRP): Since last visit, doing well. No seizures. No alleviating or aggravating factors. Tolerating CBZ.     UPDATE 10/04/16: Since last visit, doing well. No seizures. Tolerating CBZ 400mg  twice a day. No other new issues.   UPDATE 06/16/15 (VRP): Since last visit, doing well. No seizures. Doing well on CBZ 400mg  BID. Using goodrx coupon with good results.    UPDATE 06/16/14 (VRP): Since last visit, doing well. No seizures. Doing well on CBZ 400mg  BID.   UPDATE 05/29/13 (VRP): 60 year old male here for followup seizure disorder. Patient is doing well on carbamazepine 400 mg twice a day. He takes vitamin D, calcium, multivitamin daily. No seizure since 1989.   PRIOR HPI (03/12/12, Dr. ): 60 year old left-handed white single male who lives with his parents and has a history of mental retardation,complex partial, and secondarily generalized seizures followed on a yearly basis. He has not had any seizures since 1989.  He  was first seen by Dr. 1990 in January,1977 at age 52. He would feel sick and "couldn't talk". This began  in 1976. When he was in school, teachers would notice that he would drop his penciils and paper and make clicking sounds with his mouth. The episodes would last a few seconds. The patient had been on Klonopin for  hyperactivity" and was switched to phenobarb.  In June 1977 he had 2 days of 3 or 4 seizures. 12/06/1975 he was admitted to [facility] with a  generalized major motor seizure. 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 1977  and hyperactivity. His developmental  motor milestones were delayed, walking at 14 months,  toilet trained at 6 years, and did not ride a bicycle until 9 or 10 years.He did not pass the third grade and received unsatisfactory grades in grammar school. EEG 9/19/ 77 was normal. He was treated with Cylert for hyperactivity and phenobarbital 80 mg per day.He began having seizures about once every 3 months. These were complex partial seizures. He began Tegretol in 1984 after Depakote, Phenobarbital, and Dilantin were not successful and has had good results with that medication. He denies macropsia, micropsia, dj vu, strange odors or tastes. Last bone density study 04/26/2011 did not show evidence of osteopenia in the left hip but a decrease in bone density in the lumbar spine. Last CBC, CMP, and carbamazepine level 03/21/2011 were normal with a  trough carbamazepine level of 4.6 micrograms per deciliter. A CT scan of the brain with and without contrast enhancement in 1977 was normal. He  is independent in his activities of daily living. He works loading and unloading trucks.His parents have decided that they did not wish him  to drive a car. He has no side effects of medication such as diarrhea,   dizziness, double vision, or drowsiness. Does complain of numbness in his right hand intermittently without neck pain. This is relieved by shaking  his right hand. He has bilateral shoulder pain and possible rotator cuff disease. The  pain is relieved by not working.  He is independent in activities of daily living. He does not drive a car or handle his financial needs. He exercises at  the gym one or 2 times per week.      REVIEW OF SYSTEMS: Out of a complete 14 system review of symptoms, the patient complains only of the following symptoms, none and all other reviewed systems are negative.   ALLERGIES: No Known Allergies   HOME MEDICATIONS: Outpatient Medications Prior to Visit  Medication Sig Dispense Refill   carbamazepine (TEGRETOL) 200 MG tablet Take 2 tablets (400 mg total) by mouth 2 (two) times daily. 360 tablet 4   Cholecalciferol (VITAMIN D3) 1000 UNITS CAPS Take 1 capsule (1,000 Units total) by mouth daily. 30 capsule    No facility-administered medications prior to visit.     PAST MEDICAL HISTORY: Past Medical History:  Diagnosis Date   Seizures (HCC)      PAST SURGICAL HISTORY: Past Surgical History:  Procedure Laterality Date   EYE SURGERY Right 1967   ocular muscle surgery, for alignment   HERNIA REPAIR  age 38 and 3   x2,     FAMILY HISTORY: Family History  Problem Relation Age of Onset   Healthy Mother    Hypertension Father    Atrial fibrillation Father    Seizures Neg Hx    Colon cancer Neg Hx    Prostate cancer Neg Hx      SOCIAL HISTORY: Social History   Socioeconomic History   Marital status: Single    Spouse name: Not on file   Number of children: 0   Years of education: HS   Highest education level: Not on file  Occupational History    Employer: [employer]    Comment: [employer]  Tobacco Use   Smoking status: Never   Smokeless tobacco: Never  Substance and Sexual Activity   Alcohol use: No   Drug use: No   Sexual activity: Not on file  Other Topics Concern   Not on file  Social History Narrative   Patient lives at home with his parents.   Caffeine Use:  Drinks tea, coffee, and soda daily.    [employer], [employer] fan.     Works in Teaching laboratory technician.   Social Determinants of Health   Financial Resource Strain: Not on file  Food Insecurity: Not on file  Transportation Needs: Not on file  Physical Activity: Not on file  Stress: Not on file  Social Connections: Not on file  Intimate Partner Violence: Not on file      PHYSICAL EXAM  There were no vitals filed for this visit.  There is no height or weight on file to calculate BMI.   Generalized: Well developed, in no acute distress   Neurological examination  Mentation: Alert, developmental delay, oriented to time, place, history taking. Follows all commands speech  and language fluent Cranial nerve II-XII: Pupils were equal round reactive to light. Extraocular movements were full, visual field were full on confrontational test. Facial sensation and strength were normal. Uvula tongue midline. Head turning and shoulder shrug  were normal and symmetric. Motor: The motor testing reveals 5 over 5 strength of all 4 extremities. Good symmetric motor tone is noted throughout.  Sensory: Sensory testing is intact to soft touch on all 4 extremities. No evidence of extinction is noted.  Coordination: Cerebellar testing reveals good finger-nose-finger and heel-to-shin bilaterally.  Gait and station: Gait is normal.  Reflexes: Deep tendon reflexes are symmetric and normal bilaterally.     DIAGNOSTIC DATA (LABS, IMAGING, TESTING) - I reviewed patient records, labs, notes, testing and imaging myself where available.  Lab Results  Component Value Date   WBC 5.6 10/27/2020   HGB 13.8 10/27/2020   HCT 42.1 10/27/2020   MCV 90.9 10/27/2020   PLT 230.0 10/27/2020      Component Value Date/Time   NA 137 10/27/2020 1225   NA 139 01/06/2020 1419   K 5.0 10/27/2020 1225   CL 101 10/27/2020 1225   CO2 30 10/27/2020 1225   GLUCOSE 87 10/27/2020 1225   BUN 14 10/27/2020 1225   BUN 11 01/06/2020  1419   CREATININE 0.79 10/27/2020 1225   CALCIUM 9.1 10/27/2020 1225   PROT 6.9 10/27/2020 1225   PROT 6.7 01/06/2020 1419   ALBUMIN 4.3 10/27/2020 1225   ALBUMIN 4.4 01/06/2020 1419   AST 16 10/27/2020 1225   ALT 8 10/27/2020 1225   ALKPHOS 78 10/27/2020 1225   BILITOT 0.3 10/27/2020 1225   BILITOT <0.2 01/06/2020 1419   GFRNONAA 103 01/06/2020 1419   GFRAA 119 01/06/2020 1419   Lab Results  Component Value Date   CHOL 163 10/27/2020   HDL 50.80 10/27/2020   LDLCALC 95 10/27/2020   TRIG 86.0 10/27/2020   CHOLHDL 3 10/27/2020   No results found for: HGBA1C No results found for: VITAMINB12 Lab Results  Component Value Date   TSH 1.36 10/27/2020      ASSESSMENT AND PLAN  60 y.o. year old male  has a past medical history of Seizures (HCC). here with   No diagnosis found.   [name] is doing very well.  We will continue carbamazepine 400 mg twice daily. Labs reviewed in Epic and normal. He will continue vitamin D and calcium supplements. I have encouraged him to continue healthy lifestyle habits.  He will follow-up with primary care annually as directed.  He will follow-up with me in 1 year, sooner if needed.  He verbalizes understanding and agreement with this plan.   Shawnie Dapper, MSN, FNP-C 01/11/2021, 7:14 AM  [facility] [address], [address] [place], [place] [address] [phone]

## 2021-01-21 ENCOUNTER — Telehealth: Payer: Self-pay | Admitting: Family Medicine

## 2021-01-21 NOTE — Telephone Encounter (Signed)
Pt's mother has called to report that she  needs a letter stating disability  of pt to be excused from [employer].  Payton Mccallum summons is 03-04-21

## 2021-01-21 NOTE — Telephone Encounter (Signed)
Called mother back. Advised per last note, seizures well controlled, he was doing well. This would no warrant excuse from [employer]. She states she was wanting letter d/t learning disability he has. Advised it would be best for her to reach out to his PCP. She will do this. Nothing further needed.

## 2021-02-04 ENCOUNTER — Telehealth: Payer: Self-pay | Admitting: Family Medicine

## 2021-02-04 NOTE — Telephone Encounter (Signed)
Pt mother called stating that she would like a note from the doctor excusing pt from [employer]. Pt mother states that she would like it mailed to [address] [place] [address].

## 2021-02-05 NOTE — Telephone Encounter (Signed)
Letter has been placed in the mail and patients mother Harriett Sine is aware.

## 2021-02-05 NOTE — Telephone Encounter (Signed)
Letter done. Thanks.

## 2021-02-16 ENCOUNTER — Other Ambulatory Visit: Payer: Self-pay

## 2021-02-16 MED ORDER — CARBAMAZEPINE 200 MG PO TABS: 400.0000 mg | ORAL_TABLET | Freq: Two times a day (BID) | ORAL | 1 refills | Status: DC

## 2021-03-15 DIAGNOSIS — H40013 Open angle with borderline findings, low risk, bilateral: Secondary | ICD-10-CM | POA: Diagnosis not present

## 2021-05-20 ENCOUNTER — Ambulatory Visit: Payer: BC Managed Care – PPO | Admitting: Family Medicine

## 2021-07-06 ENCOUNTER — Ambulatory Visit: Payer: BC Managed Care – PPO | Admitting: Family Medicine

## 2021-07-25 ENCOUNTER — Other Ambulatory Visit: Payer: Self-pay | Admitting: Family Medicine

## 2021-07-25 DIAGNOSIS — E785 Hyperlipidemia, unspecified: Secondary | ICD-10-CM

## 2021-07-25 DIAGNOSIS — G40909 Epilepsy, unspecified, not intractable, without status epilepticus: Secondary | ICD-10-CM

## 2021-07-26 ENCOUNTER — Other Ambulatory Visit: Payer: Self-pay

## 2021-07-26 ENCOUNTER — Emergency Department (HOSPITAL_COMMUNITY)
Admission: EM | Admit: 2021-07-26 | Discharge: 2021-07-26 | Disposition: A | Discharge status: Home / Self Care | Payer: BC Managed Care – PPO | Attending: Emergency Medicine | Admitting: Emergency Medicine

## 2021-07-26 ENCOUNTER — Other Ambulatory Visit (INDEPENDENT_AMBULATORY_CARE_PROVIDER_SITE_OTHER): Payer: BC Managed Care – PPO

## 2021-07-26 ENCOUNTER — Telehealth: Payer: Self-pay

## 2021-07-26 DIAGNOSIS — G40909 Epilepsy, unspecified, not intractable, without status epilepticus: Secondary | ICD-10-CM

## 2021-07-26 DIAGNOSIS — E785 Hyperlipidemia, unspecified: Secondary | ICD-10-CM

## 2021-07-26 DIAGNOSIS — R55 Syncope and collapse: Secondary | ICD-10-CM | POA: Insufficient documentation

## 2021-07-26 DIAGNOSIS — R61 Generalized hyperhidrosis: Secondary | ICD-10-CM | POA: Diagnosis not present

## 2021-07-26 DIAGNOSIS — R42 Dizziness and giddiness: Secondary | ICD-10-CM | POA: Diagnosis not present

## 2021-07-26 LAB — COMPREHENSIVE METABOLIC PANEL
ALT: 8 U/L (ref 0–53)
AST: 16 U/L (ref 0–37)
Albumin: 4.5 g/dL (ref 3.5–5.2)
Alkaline Phosphatase: 77 U/L (ref 39–117)
BUN: 8 mg/dL (ref 6–23)
CO2: 32 mEq/L (ref 19–32)
Calcium: 9.1 mg/dL (ref 8.4–10.5)
Chloride: 101 mEq/L (ref 96–112)
Creatinine, Ser: 0.76 mg/dL (ref 0.40–1.50)
GFR: 98.75 mL/min (ref >60.00)
Glucose, Bld: 103 mg/dL — ABNORMAL HIGH (ref 70–99)
Potassium: 5 mEq/L (ref 3.5–5.1)
Sodium: 138 mEq/L (ref 135–145)
Total Bilirubin: 0.4 mg/dL (ref 0.2–1.2)
Total Protein: 7.3 g/dL (ref 6.0–8.3)

## 2021-07-26 LAB — BASIC METABOLIC PANEL
Anion gap: 6 (ref 5–15)
BUN: 7 mg/dL (ref 6–20)
CO2: 28 mmol/L (ref 22–32)
Calcium: 9.1 mg/dL (ref 8.9–10.3)
Chloride: 103 mmol/L (ref 98–111)
Creatinine, Ser: 0.73 mg/dL (ref 0.61–1.24)
GFR, Estimated: >60 mL/min (ref >60)
Glucose, Bld: 140 mg/dL — ABNORMAL HIGH (ref 70–99)
Potassium: 3.9 mmol/L (ref 3.5–5.1)
Sodium: 137 mmol/L (ref 135–145)

## 2021-07-26 LAB — CBC WITH DIFFERENTIAL/PLATELET
Basophils Absolute: 0 10*3/uL (ref 0.0–0.1)
Basophils Relative: 0.7 % (ref 0.0–3.0)
Eosinophils Absolute: 0.2 10*3/uL (ref 0.0–0.7)
Eosinophils Relative: 3.1 % (ref 0.0–5.0)
HCT: 44.9 % (ref 39.0–52.0)
Hemoglobin: 15.3 g/dL (ref 13.0–17.0)
Lymphocytes Relative: 28.2 % (ref 12.0–46.0)
Lymphs Abs: 1.4 10*3/uL (ref 0.7–4.0)
MCHC: 34 g/dL (ref 30.0–36.0)
MCV: 90.1 fl (ref 78.0–100.0)
Monocytes Absolute: 0.6 10*3/uL (ref 0.1–1.0)
Monocytes Relative: 11.6 % (ref 3.0–12.0)
Neutro Abs: 2.9 10*3/uL (ref 1.4–7.7)
Neutrophils Relative %: 56.4 % (ref 43.0–77.0)
Platelets: 203 10*3/uL (ref 150.0–400.0)
RBC: 4.99 Mil/uL (ref 4.22–5.81)
RDW: 13.8 % (ref 11.5–15.5)
WBC: 5.1 10*3/uL (ref 4.0–10.5)

## 2021-07-26 LAB — LIPID PANEL
Cholesterol: 193 mg/dL (ref 0–200)
HDL: 59.1 mg/dL (ref >39.00)
LDL Cholesterol: 117 mg/dL — ABNORMAL HIGH (ref 0–99)
NonHDL: 134.1
Total CHOL/HDL Ratio: 3
Triglycerides: 85 mg/dL (ref 0.0–149.0)
VLDL: 17 mg/dL (ref 0.0–40.0)

## 2021-07-26 LAB — CBC
HCT: 46.2 % (ref 39.0–52.0)
Hemoglobin: 15.3 g/dL (ref 13.0–17.0)
MCH: 30.1 pg (ref 26.0–34.0)
MCHC: 33.1 g/dL (ref 30.0–36.0)
MCV: 90.9 fL (ref 80.0–100.0)
Platelets: 194 10*3/uL (ref 150–400)
RBC: 5.08 MIL/uL (ref 4.22–5.81)
RDW: 13 % (ref 11.5–15.5)
WBC: 6.3 10*3/uL (ref 4.0–10.5)
nRBC: 0 % (ref 0.0–0.2)

## 2021-07-26 LAB — CK: Total CK: 124 U/L (ref 49–397)

## 2021-07-26 NOTE — ED Provider Notes (Signed)
?[facility] ?Provider Note ? ? ?CSN: [id number] ?Arrival date & time:    ? ?  ? ?History ? ?Chief Complaint  ?Patient presents with  ? Loss of Consciousness  ? ? ?[name] is a 61 y.o. male. ? ? Patient as above with significant medical history as below, including DD, seizure on tegretol who presents to the ED with complaint of syncope.  History limited secondary to developmental delay.  Patient reports he was at lab office this morning, he was fasting prior to this.  He received lab work, as he was going back to the lobby he felt like he was going to pass out.  Felt lightheaded, diaphoretic.  He thinks he lost consciousness but is not entirely sure.  Denies head injury.  Denies chest pain or palpitations.  No nausea or vomiting.  No incontinence.  Patient feels he is roughly back to his baseline.  He is unsure of his last seizure but feels it was over 20 to 30 years ago. ? ? ? ?Level 5 caveat, developmental delay ? ? ? ? ?Past Medical History: ?No date: Seizures (HCC) ? ?Past Surgical History: ?1967: EYE SURGERY; Right ?    Comment:  ocular muscle surgery, for alignment ?age 89 and [age]: HERNIA REPAIR ?    Comment:  x2,  ? ? ?The history is provided by the patient. No language interpreter was used.  ?Loss of Consciousness ?Associated symptoms: no chest pain, no confusion, no fever, no headaches, no nausea, no palpitations, no shortness of breath and no vomiting   ? ?  ? ?Home Medications ?Prior to Admission medications   ?Medication Sig Start Date End Date Taking? Authorizing Provider  ?carbamazepine (TEGRETOL) 200 MG tablet Take 2 tablets (400 mg total) by mouth 2 (two) times daily. Please keep upcoming appt for continued refills 02/16/21   Shawnie DapperLomax, Amy, NP  ?Cholecalciferol (VITAMIN D3) 1000 UNITS CAPS Take 1 capsule (1,000 Units total) by mouth daily. 05/29/13   Penumalli, Glenford BayleyVikram R, MD  ?   ? ?Allergies    ?Patient has no known allergies.   ? ?Review of Systems    ?Review of Systems  ?Constitutional:  Negative for chills and fever.  ?HENT:  Negative for facial swelling and trouble swallowing.   ?Eyes:  Negative for photophobia and visual disturbance.  ?Respiratory:  Negative for cough and shortness of breath.   ?Cardiovascular:  Positive for syncope. Negative for chest pain and palpitations.  ?Gastrointestinal:  Negative for abdominal pain, nausea and vomiting.  ?Endocrine: Negative for polydipsia and polyuria.  ?Genitourinary:  Negative for difficulty urinating and hematuria.  ?Musculoskeletal:  Negative for gait problem and joint swelling.  ?Skin:  Negative for pallor and rash.  ?Neurological:  Positive for syncope. Negative for headaches.  ?Psychiatric/Behavioral:  Negative for agitation and confusion.   ? ?Physical Exam ?Updated Vital Signs ?BP (!) 146/94 (BP Location: Right Arm)   Pulse 67   Temp (!) 97.5 ?F (36.4 ?C) (Oral)   Resp 17   SpO2 100%  ?Physical Exam ?Vitals and nursing note reviewed.  ?Constitutional:   ?   General: He is not in acute distress. ?   Appearance: Normal appearance. He is well-developed.  ?HENT:  ?   Head: Normocephalic and atraumatic. No raccoon eyes, Battle's sign, right periorbital erythema or left periorbital erythema.  ?   Jaw: There is normal jaw occlusion. No trismus.  ?   Right Ear: External ear normal.  ?   Left  Ear: External ear normal.  ?   Mouth/Throat:  ?   Mouth: Mucous membranes are moist.  ?Eyes:  ?   General: No scleral icterus. ?   Extraocular Movements: Extraocular movements intact.  ?   Pupils: Pupils are equal, round, and reactive to light.  ?Cardiovascular:  ?   Rate and Rhythm: Normal rate and regular rhythm.  ?   Pulses: Normal pulses.  ?   Heart sounds: Normal heart sounds.  ?Pulmonary:  ?   Effort: Pulmonary effort is normal. No respiratory distress.  ?   Breath sounds: Normal breath sounds.  ?Abdominal:  ?   General: Abdomen is flat.  ?   Palpations: Abdomen is soft.  ?   Tenderness: There is no abdominal  tenderness.  ?Musculoskeletal:     ?   General: Normal range of motion.  ?   Cervical back: Normal range of motion.  ?   Right lower leg: No edema.  ?   Left lower leg: No edema.  ?Skin: ?   General: Skin is warm and dry.  ?   Capillary Refill: Capillary refill takes less than 2 seconds.  ?Neurological:  ?   Mental Status: He is alert and oriented to person, place, and time.  ?   GCS: GCS eye subscore is 4. GCS verbal subscore is 5. GCS motor subscore is 6.  ?   Cranial Nerves: Cranial nerves 2-12 are intact. No dysarthria.  ?   Sensory: Sensation is intact.  ?   Motor: Motor function is intact. No tremor.  ?   Coordination: Coordination is intact.  ?   Gait: Gait is intact.  ?Psychiatric:     ?   Mood and Affect: Mood normal.     ?   Behavior: Behavior normal.  ? ? ?ED Results / Procedures / Treatments   ?Labs ?(all labs ordered are listed, but only abnormal results are displayed) ?Labs Reviewed  ?BASIC METABOLIC PANEL - Abnormal; Notable for the following components:  ?    Result Value  ? Glucose, Bld 140 (*)   ? All other components within normal limits  ?CBC  ?CK  ? ? ?EKG ?None ? ?Radiology ?No results found. ? ?Procedures ?Procedures  ? ? ?Medications Ordered in ED ?Medications - No data to display ? ?ED Course/ Medical Decision Making/ A&P ?  ?                        ?Medical Decision Making ?Amount and/or Complexity of Data Reviewed ?Labs: ordered. ? ? ? ?CC: Syncope/near syncope ? ?This patient presents to the Emergency Department for the above complaint. This involves an extensive number of treatment options and is a complaint that carries with it a high risk of complications and morbidity. Vital signs were reviewed. Serious etiologies considered. ? ?DDx includes but limited to, vasovagal, orthostatic, simple syncope, cardiac syncope, dehydration, metabolic, endocrine ? ?Record review:  ?Previous records obtained and reviewed  ?Prior labs, office visits, imaging.  Reviewed office note from today regarding  preceding events ? ?Additional history obtained from parents ? ?Medical and surgical history as noted above.  ? ?Work up as above, notable for:  ?Labs & imaging results that were available during my care of the patient were visualized by me and considered in my medical decision making. ?  ?Cardiac monitoring reviewed and interpreted personally which shows NSR ? ?Labs reviewed and are stable.  CPK within normal limits.  Patient reports his  last seizure was greater than 30 years ago. ? ?ECG without evidence of acute ischemia. ? ?Neurologic exam is nonfocal.  He feels at his baseline currently.  No external evidence of trauma.  He is ambulatory with steady gait.  Tolerating p.o. without difficulty.  No chest pain.  No murmur.  No dyspnea.  No palpitations.  No numbness or tingling. ? ? ?Recommend outpatient PCP follow-up. ? ? ?Patient presents with syncopal symptoms without worrisome features. Presentation most suggestive of neuro-cardiogenic or orthostatic cause. Very low suspicion for serious arrhythmia, cardiac ischemia or other serious etiology. ECG reviewed, no evidence of a cardiac arrhythmia such as Brugada, WPW, HOCM, IHSS, Long or short QT. Neurologic exam is nonfocal, not consistent with CVA or primary neurologic abnormality. Patient appears safe for discharge with outpatient observation and close PCP F/U. Syncope warnings discussed with patient. The patient has been instructed to return immediately if the symptoms worsen in any way. Patient verbalized understanding and is in agreement with current care plan. All questions answered prior to discharge. ? ? ? ? ? ? ? ? ? ? ? ? ?Social determinants of health include -  ?Social History  ? ?Socioeconomic History  ? Marital status: Single  ?  Spouse name: Not on file  ? Number of children: 0  ? Years of education: HS  ? Highest education level: Not on file  ?Occupational History  ?  Employer: [employer]  ?  Comment: [employer]  ?Tobacco Use  ? Smoking  status: Never  ? Smokeless tobacco: Never  ?Substance and Sexual Activity  ? Alcohol use: No  ? Drug use: No  ? Sexual activity: Not on file  ?Other Topics Concern  ? Not on file  ?Social History Narrative  ? Patient lives at

## 2021-07-26 NOTE — ED Triage Notes (Signed)
Pt BIB EMS due to a syncopal episode. Pt has a hx of seizures. His last seizure was 1989 and is compliant with carbamazepine. Pt has cognitive delay and mother states this happens when pt gets blood drawn. Pt is axox4. VSS.  ?

## 2021-07-26 NOTE — Discharge Instructions (Addendum)
Have someone stay with you until you feel stable. Do not drive, operate machinery, or play sports until your caregiver says it is okay. Keep all follow-up appointments as directed by your caregiver. Lie down right away if you start feeling like you might faint. Breathe deeply and steadily. Wait until all the symptoms have passed.Drink enough fluids to keep your urine clear or pale yellow. If you are taking blood pressure or heart medicine, get up slowly, taking several minutes to sit and then stand. This can reduce dizziness. SEEK IMMEDIATE MEDICAL CARE IF: You have a severe headache. You have unusual pain in the chest, abdomen, or back. You are bleeding from the mouth or rectum, or you have a black or tarry stool. You have an irregular or very fast heartbeat. You have pain with breathing. You have repeated fainting or seizure-like jerking during an episode. You faint when sitting or lying down. You have confusion. You have difficulty walking. You have severe weakness. You have vision problems. If you fainted, call your local emergency services - do not drive yourself to the hospital.   Please return to the emergency department immediately for any new or concerning symptoms, or if you get worse. 

## 2021-07-26 NOTE — Telephone Encounter (Signed)
Reason for pt in office today was to have CPX labs drawn;pt arrived at [facility] AM and Terri said she drew labs and pt left lab area 9:05 AM approx; pt was talking about going to work and his mom was waiting in hall way for pt; pt had no complaints when left lab area. Pt was leaving office after fasting labs and a witness in waiting room Sinclair Grooms 04/25/1968  contact for Dolores Hoose # [phone] said that he was looking at his phone but from the corner of his eye he saw pt fall to the floor. Pt was sitting on buttocks but Mr Ethelene Browns said pt was sitting still and had pt's back to Mr Ethelene Browns. Mr Ethelene Browns and pts father went to where pt was (evidently pts parents were walking in front of pt.when initially leaving the lobby area). Mr Ethelene Browns said he was not sure if pt loss consciousness or not; when Mr Josefina Do walked to pt the pt was disorientated for a few seconds; initially could not understand anything the pt said; Mr Ethelene Browns said the pt did not know where he was initially. Mr Ethelene Browns and pts father assisted pt up and into a chair; Mr Ethelene Browns said after pt was sitting in chair the pts head went back and pts legs appeared rigid for few seconds. Zettie Cooley CMA went to call back another pt and observed pt and ran into front office asking someone at front desk to call 911 and asked me to come to lobby to ck pt; when I got there pt was in an upright sitting position; pt did know his name and DOB and where he was.No pain and no complaints I took BP right arm reg cuff pt sitting BP 80/60 P 98 pulse ox 77%. Pt felt cool and clammy; reckd BP 78/60 P 88 pulse ox 77%, asked some one to get oxygen and Dr Para March. Dr Para March came and then put 2L oxygen on pt with o2 mask; pulse ox went to 99% and P stayed in 60's (66 - 68 - 67 -63). Recked BP 98/60 P 67 pulse ox 99%. Pt had drank some apple juice and ate 1 peanut butter cracker; BP 110/68 P 63 pulse ox  99% pt was no longer cool and clammy; skin was warm and dry;  Dr Para March remained by pts side until pt left via stretcher with EMS; EMS arrived BS 108.EMS talked with pt and took vitals. Med list and demographic sheet given to EMS and pt was going by ambulance to [facility] ED. Sending note to Dr Para March for additional info and Merid[facility] Abbey.(Safety zone portal will be completed after Dr Lianne Bushy notes are included. ?

## 2021-07-27 LAB — CARBAMAZEPINE LEVEL, TOTAL: Carbamazepine Lvl: 8.6 mg/L (ref 4.0–12.0)

## 2021-07-27 LAB — TSH: TSH: 1.3 u[IU]/mL (ref 0.35–5.50)

## 2021-07-27 NOTE — Telephone Encounter (Signed)
Safety zone form completed and submitted. Event # [id number]. Sending FYI to Dr Para March and Gypsy Decant. ?

## 2021-07-27 NOTE — Telephone Encounter (Signed)
I agree with and appreciate Rena's note and care for the patient.  I appreciate the help of all involved. ?By the time I saw the patient he was mentating at his baseline and his speech was at his normal baseline.  He was conversant and not in distress.  Lungs were clear and heart was regular.  Extremities look well perfused.  He remained sitting in the chair while I examined him.  He did not lose consciousness after I saw him.  I gave checkout to EMS upon their arrival and they transported the patient to the emergency room.  ? ?It is unclear to me if the patient could have had a vagal event related to having labs drawn while fasting.  I will await his ER note. ?

## 2021-07-30 ENCOUNTER — Ambulatory Visit (INDEPENDENT_AMBULATORY_CARE_PROVIDER_SITE_OTHER): Payer: BC Managed Care – PPO | Admitting: Family Medicine

## 2021-07-30 ENCOUNTER — Encounter: Payer: Self-pay | Admitting: Family Medicine

## 2021-07-30 VITALS — BP 120/60 | HR 78 | Temp 98.3°F | Ht 71.0 in | Wt 140.0 lb

## 2021-07-30 DIAGNOSIS — Z0000 Encounter for general adult medical examination without abnormal findings: Secondary | ICD-10-CM | POA: Diagnosis not present

## 2021-07-30 DIAGNOSIS — Z7189 Other specified counseling: Secondary | ICD-10-CM

## 2021-07-30 DIAGNOSIS — Z1211 Encounter for screening for malignant neoplasm of colon: Secondary | ICD-10-CM

## 2021-07-30 DIAGNOSIS — G40109 Localization-related (focal) (partial) symptomatic epilepsy and epileptic syndromes with simple partial seizures, not intractable, without status epilepticus: Secondary | ICD-10-CM

## 2021-07-30 NOTE — Patient Instructions (Addendum)
Check with your insurance to see if they will cover the shingles shot.  It may be cheaper at the pharmacy.   ?I'll update neurology.   ? ?In the future don't get blood drawn on an empty stomach.  ?Update me as needed.  ?Take care.  Glad to see you. ? ?We'll get a cologuard kit sent to your house.  ?

## 2021-07-30 NOTE — Progress Notes (Signed)
CPE- See plan.  Routine anticipatory guidance given to patient.  See health maintenance.  The possibility exists that previously documented standard health maintenance information may have been brought forward from a previous encounter into this note.  If needed, that same information has been updated to reflect the current situation based on today's encounter.   ? ?D/w patient [id number] for colon cancer screening, including IFOB vs. colonoscopy.  Risks and benefits of both were discussed and patient voiced understanding.  Pt elects for cologuard.   ?Tetanus shot 2020. ?Flu shot encouraged for the fall. ?COVID shot previously done. ?Shingles shot d/w pt.  ?Pneumonia shot discussed with patient. ?Prostate cancer screening and PSA options (with potential risks and benefits of testing vs not testing) were discussed along with recent recs/guidelines.  He declined testing PSA at this point. ?Living will d/w pt.  Parents designated if patient were incapacitated.  Then sister Randel Books designated if needed, if parents were incapacitated. ? ?D/w pt about recent events, ER eval. see previous phone note, reviewed with patient.  He was fasting that day.  No sx in the meantime. No recent seizures, not in years.  Recent events were thought not to be a seizure.  D/w pt.  He didn't have postictal confusion.  Still compliant with carbamazepine.  He has neuro f/u pending for summer 2023.  I will update neuro re: recent events.   ? ?No orthostatic sx.  Pulse 77 sitting to 84 on standing.   ? ?He doesn't drive at baseline.   ? ?PMH and SH reviewed ? ?Meds, vitals, and allergies reviewed.  ? ?ROS: Per HPI.  Unless specifically indicated otherwise in HPI, the patient denies: ? ?General: fever. ?Eyes: acute vision changes ?ENT: sore throat ?Cardiovascular: chest pain ?Respiratory: SOB ?GI: vomiting ?GU: dysuria ?Musculoskeletal: acute back pain ?Derm: acute rash ?Neuro: acute motor dysfunction ?Psych: worsening mood ?Endocrine:  polydipsia ?Heme: bleeding ?Allergy: hayfever ? ?GEN: nad, alert and oriented ?HEENT: ncat ?NECK: supple w/o LA ?CV: rrr. ?PULM: ctab, no inc wob ?ABD: soft, +bs ?EXT: no edema ?SKIN: no acute rash ?

## 2021-08-01 NOTE — Assessment & Plan Note (Signed)
D/w pt about recent events, ER eval. see previous phone note, reviewed with patient.  He was fasting that day.  No sx in the meantime. No recent seizures, not in years.  Recent events were thought not to be a seizure.  D/w pt.  He didn't have postictal confusion.  Still compliant with carbamazepine.  He has neuro f/u pending for summer 2023.  I will update neuro re: recent events.   ? ?I suspect he had a vagal event related to fasting and blood draw here at the clinic and not a seizure.  Routine cautions given to patient. ?

## 2021-08-01 NOTE — Assessment & Plan Note (Signed)
Living will d/w pt.  Parents designated if patient were incapacitated.  Then sister Gloris Ham designated if needed, if parents were incapacitated. ?

## 2021-08-01 NOTE — Assessment & Plan Note (Signed)
D/w patient [id number] for colon cancer screening, including IFOB vs. colonoscopy.  Risks and benefits of both were discussed and patient voiced understanding.  Pt elects for cologuard.   ?Tetanus shot 2020. ?Flu shot encouraged for the fall. ?COVID shot previously done. ?Shingles shot d/w pt.  ?Pneumonia shot discussed with patient. ?Prostate cancer screening and PSA options (with potential risks and benefits of testing vs not testing) were discussed along with recent recs/guidelines.  He declined testing PSA at this point. ?Living will d/w pt.  Parents designated if patient were incapacitated.  Then sister Gloris Ham designated if needed, if parents were incapacitated. ?

## 2021-08-02 NOTE — Addendum Note (Signed)
Addended by: Wendie Simmer B on: 08/02/2021 08:14 AM ? ? Modules accepted: Orders ? ?

## 2021-08-20 ENCOUNTER — Other Ambulatory Visit: Payer: Self-pay | Admitting: Neurology

## 2021-08-20 MED ORDER — CARBAMAZEPINE 200 MG PO TABS: 400.0000 mg | ORAL_TABLET | Freq: Two times a day (BID) | ORAL | 1 refills | Status: DC

## 2021-08-20 NOTE — Progress Notes (Signed)
There was a message that he is out of his carbamazepine refills and the family is at the drugstore now.  His father answered the phone and I let him know I will refill for this month and next month.  However,  he has not been seen since 2021 and he needs to make an appointment to be seen in the next 2 months if additional refills are needed.

## 2021-08-24 ENCOUNTER — Telehealth: Payer: Self-pay | Admitting: *Deleted

## 2021-08-24 NOTE — Telephone Encounter (Signed)
Called and spoke with mother. Reminded her about son's upcoming appt on 10/11/21 at 10:30am with AL,NP. Expressed importance of keeping this appt in order to get future refills for carbamazepine. She verbalized understanding and confirmed she has date/time written down on their calender as reminder for appt.

## 2021-10-11 ENCOUNTER — Ambulatory Visit (INDEPENDENT_AMBULATORY_CARE_PROVIDER_SITE_OTHER): Payer: BC Managed Care – PPO | Admitting: Family Medicine

## 2021-10-11 ENCOUNTER — Encounter: Payer: Self-pay | Admitting: Family Medicine

## 2021-10-11 ENCOUNTER — Ambulatory Visit: Payer: BC Managed Care – PPO | Admitting: Family Medicine

## 2021-10-11 VITALS — BP 118/80 | HR 68 | Ht 71.0 in | Wt 139.5 lb

## 2021-10-11 DIAGNOSIS — G40109 Localization-related (focal) (partial) symptomatic epilepsy and epileptic syndromes with simple partial seizures, not intractable, without status epilepticus: Secondary | ICD-10-CM | POA: Diagnosis not present

## 2021-10-11 MED ORDER — CARBAMAZEPINE 200 MG PO TABS
400.0000 mg | ORAL_TABLET | Freq: Two times a day (BID) | ORAL | 3 refills | Status: DC
Start: 2021-10-11 — End: 2021-11-30

## 2021-10-11 NOTE — Patient Instructions (Signed)
Below is our plan:  We will continue carbamazepine 400mg  twice daily  Please make sure you are consistent with timing of seizure medication. I recommend annual visit with primary care provider (PCP) for complete physical and routine blood work. We will monitor vitamin D level. I recommend daily intake of vitamin D (400-800iu) and calcium (800-1000mg ) for bone health. Discuss Dexa screening with PCP.   Please maintain precautions. Do not participate in activities where a loss of awareness could harm you or someone else. No swimming alone, no tub bathing, no hot tubs, no driving, no operating motorized vehicles (cars, ATVs, motocycles, etc), lawnmowers, power tools or firearms. No standing at heights, such as rooftops, ladders or stairs. Avoid hot objects such as stoves, heaters, open fires. Wear a helmet when riding a bicycle, scooter, skateboard, etc. and avoid areas of traffic. Set your water heater to 120 degrees or less.   Please make sure you are staying well hydrated. I recommend 50-60 ounces daily. Well balanced diet and regular exercise encouraged. Consistent sleep schedule with 6-8 hours recommended.   Please continue follow up with care team as directed.   Follow up with me in 1 year   You may receive a survey regarding today's visit. I encourage you to leave honest feed back as I do use this information to improve patient care. Thank you for seeing me today!

## 2021-10-11 NOTE — Progress Notes (Signed)
Chief Complaint  Patient presents with   Follow-up    Rm 2, w mother. Here for yearly sz f/u. Pt reports no sz like activity since last ov. No missed doses. Overall pt is doing well.     HISTORY OF PRESENT ILLNESS:  10/11/21 ALL: [name] returns for follow up. He continues carbamazepine 400mg  twice daily. No recent seizure activity. He continues to work full time. He lives with his parents. He does not drive. He is followed by Dr Damita Dunnings regularly. Last labs 07/2021 were unremarkable.   01/06/2020 ALL: [name] is a 61 y.o. male here today for follow up for seizures.  He continues carbamazepine 400 mg twice daily.  He is doing very well and tolerating medication.  No recent seizure activity.  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  He was last seen by primary care and 02/2019.  He has not had recent lab work.   HISTORY (copied from my note on 01/02/2019)  [name] is a 61 y.o. male here today for follow up for seizures. He continues carbamazepine 400mg  twice daily. He is tolerating medicaitons well with no adverse effects noted. No seizure activity. 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 He lives with his mother and father.      HISTORY: (copied from Dr Gladstone Lighter note on 12/20/2017)   UPDATE (12/20/17, [facility]P): Since last visit, doing well. No seizures. No alleviating or aggravating factors. Tolerating CBZ.     UPDATE 10/04/16: Since last visit, doing well. No seizures. Tolerating CBZ 400mg  twice a day. No other new issues.   UPDATE 06/16/15 (VRP): Since last visit, doing well. No seizures. Doing well on CBZ 400mg  BID. Using goodrx coupon with good results.    UPDATE 06/16/14 (VRP): Since last visit, doing well. No seizures. Doing well on CBZ 400mg  BID.   UPDATE 05/29/13 (VRP): 61 year old male here for followup seizure disorder. Patient is doing well on carbamazepine 400 mg twice a day. He takes vitamin D, calcium, multivitamin daily. No seizure  since 1989.   PRIOR HPI (03/12/12, Dr. Erling Cruz): 61 year old left-handed white single male who lives with his parents and has a history of mental retardation,complex partial, and secondarily generalized seizures followed on a yearly basis. He has not had any seizures since 1989.  He was first seen by Dr. Johnnye Sima in January,1977 at age 62. He would feel sick and "couldn't talk". This began  in 1976. When he was in school, teachers would notice that he would drop his penciils and paper and make clicking sounds with his mouth. The episodes would last a few seconds. The patient had been on Klonopin for  hyperactivity" and was switched to phenobarb.  In June 1977 he had 2 days of 3 or 4 seizures. 12/06/1975 he was admitted to [facility] with a  generalized major motor seizure. 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 Donna Christen  and hyperactivity. His developmental  motor milestones were delayed, walking at 14 months,  toilet trained at 6 years, and did not ride a bicycle until 9 or 10 years.He did not pass the third grade and received unsatisfactory grades in grammar school. EEG 9/19/ 77 was normal. He was treated with Cylert for hyperactivity and phenobarbital 80 mg per day.He began having seizures about once every 3 months. These were complex partial seizures. He began Tegretol in 1984 after Depakote, Phenobarbital, and Dilantin were  not successful and has had good results with that medication. He denies macropsia, micropsia, dj vu, strange odors or tastes. Last bone density study 04/26/2011 did not show evidence of osteopenia in the left hip but a decrease in bone density in the lumbar spine. Last CBC, CMP, and carbamazepine level 03/21/2011 were normal with a  trough carbamazepine level of 4.6 micrograms per deciliter. A CT scan of the brain with and without contrast enhancement in 1977 was normal. He  is independent in his activities of daily living. He works loading and  unloading trucks.His parents have decided that they did not wish him  to drive a car. He has no side effects of medication such as diarrhea,   dizziness, double vision, or drowsiness. Does complain of numbness in his right hand intermittently without neck pain. This is relieved by shaking his right hand. He has bilateral shoulder pain and possible rotator cuff disease. The  pain is relieved by not working.  He is independent in activities of daily living. He does not drive a car or handle his financial needs. He exercises at  the gym one or 2 times per week.    REVIEW OF SYSTEMS: Out of a complete 14 system review of symptoms, the patient complains only of the following symptoms, none and all other reviewed systems are negative.   ALLERGIES: No Known Allergies   HOME MEDICATIONS: Outpatient Medications Prior to Visit  Medication Sig Dispense Refill   Cholecalciferol (VITAMIN D3) 1000 UNITS CAPS Take 1 capsule (1,000 Units total) by mouth daily. 30 capsule    carbamazepine (TEGRETOL) 200 MG tablet Take 2 tablets (400 mg total) by mouth 2 (two) times daily. Must make an appointment for additional refills 120 tablet 1   No facility-administered medications prior to visit.     PAST MEDICAL HISTORY: Past Medical History:  Diagnosis Date   Seizures ([place])      PAST SURGICAL HISTORY: Past Surgical History:  Procedure Laterality Date   EYE SURGERY Right 1967   ocular muscle surgery, for alignment   HERNIA REPAIR  age 85 and 82   x2,     FAMILY HISTORY: Family History  Problem Relation Age of Onset   Healthy Mother    Hypertension Father    Atrial fibrillation Father    Seizures Neg Hx    Colon cancer Neg Hx    Prostate cancer Neg Hx      SOCIAL HISTORY: Social History   Socioeconomic History   Marital status: Single    Spouse name: Not on file   Number of children: 0   Years of education: HS   Highest education level: Not on file  Occupational History    Employer:  [employer]    Comment: [employer]  Tobacco Use   Smoking status: Never   Smokeless tobacco: Never  Substance and Sexual Activity   Alcohol use: No   Drug use: No   Sexual activity: Not on file  Other Topics Concern   Not on file  Social History Narrative   Patient lives at home with his parents.   Caffeine Use: Drinks tea, coffee, and soda daily.    [place], [place] [employer] fan.     Works in Scientist, research (life sciences).   Social Determinants of Health   Financial Resource Strain: Not on file  Food Insecurity: Not on file  Transportation Needs: Not on file  Physical Activity: Not on file  Stress: Not on file  Social Connections: Not on file  Intimate Partner Violence: Not on file      PHYSICAL EXAM  Vitals:   10/11/21 1028  BP: 118/80  Pulse: 68  Weight: 139 lb 8 oz (63.3 kg)  Height: 5\' 11"  (1.803 m)   Body mass index is 19.46 kg/m.   Generalized: Well developed, in no acute distress   Neurological examination  Mentation: Alert, developmental delay, oriented to time, place, history taking. Follows all commands speech and language fluent Cranial nerve II-XII: Pupils were equal round reactive to light. Extraocular movements were full, visual field were full on confrontational test. Facial sensation and strength were normal. Uvula tongue midline. Head turning and shoulder shrug  were normal and symmetric. Motor: The motor testing reveals 5 over 5 strength of all 4 extremities. Good symmetric motor tone is noted throughout.  Sensory: Sensory testing is intact to soft touch on all 4 extremities. No evidence of extinction is noted.  Coordination: Cerebellar testing reveals good finger-nose-finger and heel-to-shin bilaterally.  Gait and station: Gait is normal.  Reflexes: Deep tendon reflexes are symmetric and normal bilaterally.     DIAGNOSTIC DATA (LABS, IMAGING, TESTING) - I reviewed patient records, labs, notes, testing and imaging myself where available.  Lab Results   Component Value Date   WBC 6.3 07/26/2021   HGB 15.3 07/26/2021   HCT 46.2 07/26/2021   MCV 90.9 07/26/2021   PLT 194 07/26/2021      Component Value Date/Time   NA 137 07/26/2021 1044   NA 139 01/06/2020 1419   K 3.9 07/26/2021 1044   CL 103 07/26/2021 1044   CO2 28 07/26/2021 1044   GLUCOSE 140 (H) 07/26/2021 1044   BUN 7 07/26/2021 1044   BUN 11 01/06/2020 1419   CREATININE 0.73 07/26/2021 1044   CALCIUM 9.1 07/26/2021 1044   PROT 7.3 07/26/2021 0904   PROT 6.7 01/06/2020 1419   ALBUMIN 4.5 07/26/2021 0904   ALBUMIN 4.4 01/06/2020 1419   AST 16 07/26/2021 0904   ALT 8 07/26/2021 0904   ALKPHOS 77 07/26/2021 0904   BILITOT 0.4 07/26/2021 0904   BILITOT <0.2 01/06/2020 1419   GFRNONAA >60 07/26/2021 1044   GFRAA 119 01/06/2020 1419   Lab Results  Component Value Date   CHOL 193 07/26/2021   HDL 59.10 07/26/2021   LDLCALC 117 (H) 07/26/2021   TRIG 85.0 07/26/2021   CHOLHDL 3 07/26/2021   No results found for: "HGBA1C" No results found for: "VITAMINB12" Lab Results  Component Value Date   TSH 1.30 07/26/2021      ASSESSMENT AND PLAN  61 y.o. year old male  has a past medical history of Seizures (HCC). 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  We will continue carbamazepine 400 mg twice daily. Labs from 07/2021 reviewed in Epic. I have encouraged him to continue healthy lifestyle habits.  He will follow-up with primary care annually as directed.  He will follow-up with me in 1 year, sooner if needed.  He verbalizes understanding and agreement with this plan.  Meds ordered this encounter  Medications   carbamazepine (TEGRETOL) 200 MG tablet    Sig: Take 2 tablets (400 mg total) by mouth 2 (two) times daily. Must make an appointment for additional refills    Dispense:  180 tablet    Refill:  3    Order Specific Question:   Supervising Provider    Answer:   08/2021 Anson Fret     [[id number]], MSN, FNP-C  10/11/2021,  11:10 AM  [facility] [address], [address] [place], [place] [address] [phone]

## 2021-11-30 ENCOUNTER — Telehealth: Payer: Self-pay | Admitting: Family Medicine

## 2021-11-30 MED ORDER — CARBAMAZEPINE 200 MG PO TABS: 400.0000 mg | ORAL_TABLET | Freq: Two times a day (BID) | ORAL | 3 refills | Status: DC

## 2021-11-30 NOTE — Telephone Encounter (Signed)
Pt's mother called needing to discuss the pt's carbamazepine (TEGRETOL) 200 MG tablet with the RN due to a mixup that has happened with his medication

## 2021-11-30 NOTE — Telephone Encounter (Signed)
Called mother back. States rx carbamazepine 200mg , 2 caps po BID sent for wrong qty in July (#180). Should be #360 for 3 month supply. I sent updated prescription with correction to [facility]. Asked her to call back if they have any further issues filling prescription. She verbalized understanding.

## 2022-01-18 DIAGNOSIS — H25013 Cortical age-related cataract, bilateral: Secondary | ICD-10-CM | POA: Diagnosis not present

## 2022-01-18 DIAGNOSIS — H5203 Hypermetropia, bilateral: Secondary | ICD-10-CM | POA: Diagnosis not present

## 2022-01-18 DIAGNOSIS — H40013 Open angle with borderline findings, low risk, bilateral: Secondary | ICD-10-CM | POA: Diagnosis not present

## 2022-01-18 DIAGNOSIS — H2513 Age-related nuclear cataract, bilateral: Secondary | ICD-10-CM | POA: Diagnosis not present

## 2022-01-18 DIAGNOSIS — H35373 Puckering of macula, bilateral: Secondary | ICD-10-CM | POA: Diagnosis not present

## 2022-06-07 ENCOUNTER — Telehealth: Payer: Self-pay | Admitting: Family Medicine

## 2022-06-07 NOTE — Telephone Encounter (Signed)
Pt called in requesting a call back to discuss his medication . Stated he just came home form the hospital . Please advise [phone]

## 2022-06-07 NOTE — Telephone Encounter (Signed)
Message put in wrong chart; message was for patients father.

## 2022-07-18 ENCOUNTER — Telehealth: Payer: Self-pay | Admitting: Family Medicine

## 2022-07-18 NOTE — Telephone Encounter (Signed)
Patient's sister Lupe Carney (on [place]) called about  FMLA paperwork that had been emailed to [place] to be filled out by provider. Patient requested to send it via fax to [facility]. Call Patient to pick up within 3-days. Paperwork has to be submitted by 5.3.24. Document is located in providers tray at front office. Please call Rebekah at  [phone]  when paperwork has been faxed.

## 2022-07-18 NOTE — Telephone Encounter (Signed)
Forms placed in Dr. Duncans inbox °

## 2022-07-19 ENCOUNTER — Encounter: Payer: Self-pay | Admitting: Family Medicine

## 2022-07-21 NOTE — Telephone Encounter (Signed)
PPW has been faxed and family has been notified.

## 2022-08-09 DIAGNOSIS — H40013 Open angle with borderline findings, low risk, bilateral: Secondary | ICD-10-CM | POA: Diagnosis not present

## 2022-10-11 NOTE — Patient Instructions (Signed)
Below is our plan:  We will continue carbamazepine 400mg  twice daily   Please make sure you are consistent with timing of seizure medication. I recommend annual visit with primary care provider (PCP) for complete physical and routine blood work. I recommend daily intake of vitamin D (400-800iu) and calcium (800-1000mg ) for bone health. Discuss Dexa screening with PCP.   According to [place] law, you can not drive unless you are seizure / syncope free for at least 6 months and under physician's care.  Please maintain precautions. Do not participate in activities where a loss of awareness could harm you or someone else. No swimming alone, no tub bathing, no hot tubs, no driving, no operating motorized vehicles (cars, ATVs, motocycles, etc), lawnmowers, power tools or firearms. No standing at heights, such as rooftops, ladders or stairs. Avoid hot objects such as stoves, heaters, open fires. Wear a helmet when riding a bicycle, scooter, skateboard, etc. and avoid areas of traffic. Set your water heater to 120 degrees or less.  SUDEP is the sudden, unexpected death of someone with epilepsy, who was otherwise healthy. In SUDEP cases, no other cause of death is found when an autopsy is done. Each year, more than 1 in 1,000 people with epilepsy die from SUDEP. This is the leading cause of death in people with uncontrolled seizures. Until further answers are available, the best way to prevent SUDEP is to lower your risk by controlling seizures. Research has found that people with all types of epilepsy that experience convulsive seizures can be at risk.  Please make sure you are staying well hydrated. I recommend 50-60 ounces daily. Well balanced diet and regular exercise encouraged. Consistent sleep schedule with 6-8 hours recommended.   Please continue follow up with care team as directed.   Follow up with me in 1 year  You may receive a survey regarding today's visit. I encourage you to leave honest feed back  as I do use this information to improve patient care. Thank you for seeing me today!

## 2022-10-11 NOTE — Progress Notes (Unsigned)
No chief complaint on file.   HISTORY OF PRESENT ILLNESS:  10/11/22 ALL: [name] returns for follow up for seizures.   10/11/2021 ALL: [name] returns for follow up. He continues carbamazepine 400mg  twice daily. No recent seizure activity. He continues to work full time. He lives with his parents. He does not drive. He is followed by Dr Para March regularly. Last labs 07/2021 were unremarkable.   01/06/2020 ALL: [name] is a 62 y.o. male here today for follow up for seizures.  He continues carbamazepine 400 mg twice daily.  He is doing very well and tolerating medication.  No recent seizure activity.  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  He was last seen by primary care and 02/2019.  He has not had recent lab work.   HISTORY (copied from my note on 01/02/2019)  [name] is a 62 y.o. male here today for follow up for seizures. He continues carbamazepine 400mg  twice daily. He is tolerating medicaitons well with no adverse effects noted. No seizure activity. 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 He lives with his mother and father.      HISTORY: (copied from Dr Richrd Humbles note on 12/20/2017)   UPDATE (12/20/17, VRP): Since last visit, doing well. No seizures. No alleviating or aggravating factors. Tolerating CBZ.     UPDATE 10/04/16: Since last visit, doing well. No seizures. Tolerating CBZ 400mg  twice a day. No other new issues.   UPDATE 06/16/15 (VRP): Since last visit, doing well. No seizures. Doing well on CBZ 400mg  BID. Using goodrx coupon with good results.    UPDATE 06/16/14 (VRP): Since last visit, doing well. No seizures. Doing well on CBZ 400mg  BID.   UPDATE 05/29/13 (VRP): 62 year old male here for followup seizure disorder. Patient is doing well on carbamazepine 400 mg twice a day. He takes vitamin D, calcium, multivitamin daily. No seizure since 1989.   PRIOR HPI (03/12/12, Dr. Sandria Manly): 62 year old left-handed white single male who  lives with his parents and has a history of mental retardation,complex partial, and secondarily generalized seizures followed on a yearly basis. He has not had any seizures since 1989.  He was first seen by Dr. Ninetta Lights in January,1977 at age 58. He would feel sick and "couldn't talk". This began  in 1976. When he was in school, teachers would notice that he would drop his penciils and paper and make clicking sounds with his mouth. The episodes would last a few seconds. The patient had been on Klonopin for  hyperactivity" and was switched to phenobarb.  In June 1977 he had 2 days of 3 or 4 seizures. 12/06/1975 he was admitted to [facility] with a  generalized major motor seizure. 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 Lonell Face  and hyperactivity. His developmental  motor milestones were delayed, walking at 14 months,  toilet trained at 6 years, and did not ride a bicycle until 9 or 10 years.He did not pass the third grade and received unsatisfactory grades in grammar school. EEG 9/19/ 77 was normal. He was treated with Cylert for hyperactivity and phenobarbital 80 mg per day.He began having seizures about once every 3 months. These were complex partial seizures. He began Tegretol in 1984 after Depakote, Phenobarbital, and Dilantin were not successful and has had good results with that medication. He denies macropsia, micropsia, dj vu, strange odors or tastes. Last bone density study  04/26/2011 did not show evidence of osteopenia in the left hip but a decrease in bone density in the lumbar spine. Last CBC, CMP, and carbamazepine level 03/21/2011 were normal with a  trough carbamazepine level of 4.6 micrograms per deciliter. A CT scan of the brain with and without contrast enhancement in 1977 was normal. He  is independent in his activities of daily living. He works loading and unloading trucks.His parents have decided that they did not wish him  to drive a car. He has no side  effects of medication such as diarrhea,   dizziness, double vision, or drowsiness. Does complain of numbness in his right hand intermittently without neck pain. This is relieved by shaking his right hand. He has bilateral shoulder pain and possible rotator cuff disease. The  pain is relieved by not working.  He is independent in activities of daily living. He does not drive a car or handle his financial needs. He exercises at  the gym one or 2 times per week.    REVIEW OF SYSTEMS: Out of a complete 14 system review of symptoms, the patient complains only of the following symptoms, none and all other reviewed systems are negative.   ALLERGIES: No Known Allergies   HOME MEDICATIONS: Outpatient Medications Prior to Visit  Medication Sig Dispense Refill   carbamazepine (TEGRETOL) 200 MG tablet Take 2 tablets (400 mg total) by mouth 2 (two) times daily. 360 tablet 3   Cholecalciferol (VITAMIN D3) 1000 UNITS CAPS Take 1 capsule (1,000 Units total) by mouth daily. 30 capsule    No facility-administered medications prior to visit.     PAST MEDICAL HISTORY: Past Medical History:  Diagnosis Date   Seizures (HCC)      PAST SURGICAL HISTORY: Past Surgical History:  Procedure Laterality Date   EYE SURGERY Right 1967   ocular muscle surgery, for alignment   HERNIA REPAIR  age 33 and 37   x2,     FAMILY HISTORY: Family History  Problem Relation Age of Onset   Healthy Mother    Hypertension Father    Atrial fibrillation Father    Seizures Neg Hx    Colon cancer Neg Hx    Prostate cancer Neg Hx      SOCIAL HISTORY: Social History   Socioeconomic History   Marital status: Single    Spouse name: Not on file   Number of children: 0   Years of education: HS   Highest education level: Not on file  Occupational History    Employer: [employer]    Comment: [employer]  Tobacco Use   Smoking status: Never   Smokeless tobacco: Never  Substance and Sexual Activity   Alcohol  use: No   Drug use: No   Sexual activity: Not on file  Other Topics Concern   Not on file  Social History Narrative   Patient lives at home with his parents.   Caffeine Use: Drinks tea, coffee, and soda daily.    [place], [place] [employer] fan.     Works in Teaching laboratory technician.   Social Determinants of Health   Financial Resource Strain: Not on file  Food Insecurity: Not on file  Transportation Needs: Not on file  Physical Activity: Not on file  Stress: Not on file  Social Connections: Not on file  Intimate Partner Violence: Not on file      PHYSICAL EXAM  There were no vitals filed for this visit.  There is no height or weight on file  to calculate BMI.   Generalized: Well developed, in no acute distress   Neurological examination  Mentation: Alert, developmental delay, oriented to time, place, history taking. Follows all commands speech and language fluent Cranial nerve II-XII: Pupils were equal round reactive to light. Extraocular movements were full, visual field were full on confrontational test. Facial sensation and strength were normal. Uvula tongue midline. Head turning and shoulder shrug  were normal and symmetric. Motor: The motor testing reveals 5 over 5 strength of all 4 extremities. Good symmetric motor tone is noted throughout.  Sensory: Sensory testing is intact to soft touch on all 4 extremities. No evidence of extinction is noted.  Coordination: Cerebellar testing reveals good finger-nose-finger and heel-to-shin bilaterally.  Gait and station: Gait is normal.  Reflexes: Deep tendon reflexes are symmetric and normal bilaterally.     DIAGNOSTIC DATA (LABS, IMAGING, TESTING) - I reviewed patient records, labs, notes, testing and imaging myself where available.  Lab Results  Component Value Date   WBC 6.3 07/26/2021   HGB 15.3 07/26/2021   HCT 46.2 07/26/2021   MCV 90.9 07/26/2021   PLT 194 07/26/2021      Component Value Date/Time   NA 137 07/26/2021 1044    NA 139 01/06/2020 1419   K 3.9 07/26/2021 1044   CL 103 07/26/2021 1044   CO2 28 07/26/2021 1044   GLUCOSE 140 (H) 07/26/2021 1044   BUN 7 07/26/2021 1044   BUN 11 01/06/2020 1419   CREATININE 0.73 07/26/2021 1044   CALCIUM 9.1 07/26/2021 1044   PROT 7.3 07/26/2021 0904   PROT 6.7 01/06/2020 1419   ALBUMIN 4.5 07/26/2021 0904   ALBUMIN 4.4 01/06/2020 1419   AST 16 07/26/2021 0904   ALT 8 07/26/2021 0904   ALKPHOS 77 07/26/2021 0904   BILITOT 0.4 07/26/2021 0904   BILITOT <0.2 01/06/2020 1419   GFRNONAA >60 07/26/2021 1044   GFRAA 119 01/06/2020 1419   Lab Results  Component Value Date   CHOL 193 07/26/2021   HDL 59.10 07/26/2021   LDLCALC 117 (H) 07/26/2021   TRIG 85.0 07/26/2021   CHOLHDL 3 07/26/2021   No results found for: "HGBA1C" No results found for: "VITAMINB12" Lab Results  Component Value Date   TSH 1.30 07/26/2021      ASSESSMENT AND PLAN  62 y.o. year old male  has a past medical history of Seizures (HCC). here with   No diagnosis found.  Taite is doing very well.  We will continue carbamazepine 400 mg twice daily. Labs from 07/2021 reviewed in Epic. I have encouraged him to continue healthy lifestyle habits.  He will follow-up with primary care annually as directed.  He will follow-up with me in 1 year, sooner if needed.  He verbalizes understanding and agreement with this plan.  No orders of the defined types were placed in this encounter.    Shawnie Dapper, MSN, FNP-C 10/11/2022, 12:46 PM  [facility] [address], [address] [place], [place] [address] [phone]

## 2022-10-12 ENCOUNTER — Encounter: Payer: Self-pay | Admitting: Family Medicine

## 2022-10-12 ENCOUNTER — Ambulatory Visit: Payer: BC Managed Care – PPO | Admitting: Family Medicine

## 2022-10-12 VITALS — BP 127/77 | HR 71 | Ht 70.0 in | Wt 144.0 lb

## 2022-10-12 DIAGNOSIS — G40109 Localization-related (focal) (partial) symptomatic epilepsy and epileptic syndromes with simple partial seizures, not intractable, without status epilepticus: Secondary | ICD-10-CM

## 2022-10-12 MED ORDER — CARBAMAZEPINE 200 MG PO TABS: 400.0000 mg | ORAL_TABLET | Freq: Two times a day (BID) | ORAL | 3 refills | Status: DC

## 2022-10-19 ENCOUNTER — Encounter (INDEPENDENT_AMBULATORY_CARE_PROVIDER_SITE_OTHER): Payer: Self-pay

## 2022-10-30 ENCOUNTER — Other Ambulatory Visit: Payer: Self-pay | Admitting: Family Medicine

## 2022-10-30 DIAGNOSIS — G40909 Epilepsy, unspecified, not intractable, without status epilepticus: Secondary | ICD-10-CM

## 2022-10-30 DIAGNOSIS — E785 Hyperlipidemia, unspecified: Secondary | ICD-10-CM

## 2022-10-31 ENCOUNTER — Telehealth: Payer: Self-pay

## 2022-10-31 ENCOUNTER — Other Ambulatory Visit (INDEPENDENT_AMBULATORY_CARE_PROVIDER_SITE_OTHER): Payer: BC Managed Care – PPO

## 2022-10-31 DIAGNOSIS — E785 Hyperlipidemia, unspecified: Secondary | ICD-10-CM | POA: Diagnosis not present

## 2022-10-31 DIAGNOSIS — G40909 Epilepsy, unspecified, not intractable, without status epilepticus: Secondary | ICD-10-CM

## 2022-10-31 LAB — CBC WITH DIFFERENTIAL/PLATELET
Basophils Absolute: 0 10*3/uL (ref 0.0–0.1)
Basophils Relative: 0.7 % (ref 0.0–3.0)
Eosinophils Absolute: 0.1 10*3/uL (ref 0.0–0.7)
Eosinophils Relative: 2.6 % (ref 0.0–5.0)
HCT: 44.4 % (ref 39.0–52.0)
Hemoglobin: 14.9 g/dL (ref 13.0–17.0)
Lymphocytes Relative: 32.1 % (ref 12.0–46.0)
Lymphs Abs: 1.6 10*3/uL (ref 0.7–4.0)
MCHC: 33.6 g/dL (ref 30.0–36.0)
MCV: 91.2 fl (ref 78.0–100.0)
Monocytes Absolute: 0.6 10*3/uL (ref 0.1–1.0)
Monocytes Relative: 11.6 % (ref 3.0–12.0)
Neutro Abs: 2.7 10*3/uL (ref 1.4–7.7)
Neutrophils Relative %: 53 % (ref 43.0–77.0)
Platelets: 212 10*3/uL (ref 150.0–400.0)
RBC: 4.87 Mil/uL (ref 4.22–5.81)
RDW: 14.1 % (ref 11.5–15.5)
WBC: 5.1 10*3/uL (ref 4.0–10.5)

## 2022-10-31 LAB — COMPREHENSIVE METABOLIC PANEL
ALT: 8 U/L (ref 0–53)
AST: 16 U/L (ref 0–37)
Albumin: 4.4 g/dL (ref 3.5–5.2)
Alkaline Phosphatase: 83 U/L (ref 39–117)
BUN: 10 mg/dL (ref 6–23)
CO2: 31 mEq/L (ref 19–32)
Calcium: 9.3 mg/dL (ref 8.4–10.5)
Chloride: 100 mEq/L (ref 96–112)
Creatinine, Ser: 0.78 mg/dL (ref 0.40–1.50)
GFR: 97.11 mL/min (ref >60.00)
Glucose, Bld: 101 mg/dL — ABNORMAL HIGH (ref 70–99)
Potassium: 4.8 mEq/L (ref 3.5–5.1)
Sodium: 135 mEq/L (ref 135–145)
Total Bilirubin: 0.3 mg/dL (ref 0.2–1.2)
Total Protein: 6.7 g/dL (ref 6.0–8.3)

## 2022-10-31 LAB — LIPID PANEL
Cholesterol: 183 mg/dL (ref 0–200)
HDL: 50.5 mg/dL (ref >39.00)
LDL Cholesterol: 112 mg/dL — ABNORMAL HIGH (ref 0–99)
NonHDL: 132.93
Total CHOL/HDL Ratio: 4
Triglycerides: 105 mg/dL (ref 0.0–149.0)
VLDL: 21 mg/dL (ref 0.0–40.0)

## 2022-10-31 LAB — TSH: TSH: 1.36 u[IU]/mL (ref 0.35–5.50)

## 2022-10-31 NOTE — Telephone Encounter (Signed)
I saw patient after he had a snack here at clinic.  He felt better.  Speech at baseline.  RRR CTAB abd soft and moving ext at baseline.  He felt well enough to go home.  H/o similar with prolonged fasting per patient report.  We agreed reasonable to defer fasting labs in the future.

## 2022-10-31 NOTE — Telephone Encounter (Signed)
Received a message that pt fell out front.  When I arrived pt was sitting in a wheelchair.  Pt brought to treatment room for assessment of injuries.  Pt was here for fasting labwork.  Pt accompanied by sister, who reported that she did not witness the fall because she was getting the car to pick him up at the door.  She found pt sitting on sidewalk with legs extended. Pt denied hitting his head.  Initial BP 80/58 and reported his head "felt weird."  Pt has developmental delay could not tell me if he backed out or was dizzy, only described his head as feeling "weird." Pt given juice and graham crackers.  Next BP 90/64.  Dr. Para March assessed pt, BP at that time was 100/60.  Pt reported that his head didn't feel weird anymore and was assisted to the car without event.

## 2022-11-07 ENCOUNTER — Ambulatory Visit (INDEPENDENT_AMBULATORY_CARE_PROVIDER_SITE_OTHER): Payer: BC Managed Care – PPO | Admitting: Family Medicine

## 2022-11-07 ENCOUNTER — Encounter: Payer: Self-pay | Admitting: Family Medicine

## 2022-11-07 VITALS — BP 102/58 | HR 68 | Temp 98.3°F | Ht 70.0 in | Wt 139.0 lb

## 2022-11-07 DIAGNOSIS — Z7189 Other specified counseling: Secondary | ICD-10-CM

## 2022-11-07 DIAGNOSIS — G40109 Localization-related (focal) (partial) symptomatic epilepsy and epileptic syndromes with simple partial seizures, not intractable, without status epilepticus: Secondary | ICD-10-CM

## 2022-11-07 DIAGNOSIS — Z0000 Encounter for general adult medical examination without abnormal findings: Secondary | ICD-10-CM

## 2022-11-07 DIAGNOSIS — Z1211 Encounter for screening for malignant neoplasm of colon: Secondary | ICD-10-CM

## 2022-11-07 NOTE — Patient Instructions (Signed)
Cologuard when possible.  Don't change your meds.  I'll update neurology.  Take care.  Glad to see you.

## 2022-11-07 NOTE — Progress Notes (Unsigned)
CPE- See plan.  Routine anticipatory guidance given to patient.  See health maintenance.  The possibility exists that previously documented standard health maintenance information may have been brought forward from a previous encounter into this note.  If needed, that same information has been updated to reflect the current situation based on today's encounter.    D/w patient [id number] for colon cancer screening, including IFOB vs. colonoscopy.  Risks and benefits of both were discussed and patient voiced understanding.  Pt elects for cologuard.   Tetanus shot 2020. Flu shot encouraged for the fall. COVID shot previously done. Shingles shot d/w pt.  Pneumonia shot discussed with patient. Prostate cancer screening and PSA options (with potential risks and benefits of testing vs not testing) were discussed along with recent recs/guidelines.  He declined testing PSA at this point. Living will d/w pt.  Mother and also sister Randel Books equally designated if needed.  He isn't lightheaded today.  D/w pt about taking vitamin D.   H/o seizures.  No events in years.  Compliant.  I will forward a copy of his labs to neurology.  His father passed away this year.  Condolences offered.  His brother has esophageal cancer.  Discussed.  He does not have symptoms that would direct esophageal cancer evaluation.  I asked his sister to check to see if the brother who has esophageal cancer has had any genetic screening done.  They can update me as needed.  PMH and SH reviewed  Meds, vitals, and allergies reviewed.   ROS: Per HPI.  Unless specifically indicated otherwise in HPI, the patient denies:  General: fever. Eyes: acute vision changes ENT: sore throat Cardiovascular: chest pain Respiratory: SOB GI: vomiting GU: dysuria Musculoskeletal: acute back pain Derm: acute rash Neuro: acute motor dysfunction Psych: worsening mood Endocrine: polydipsia Heme: bleeding Allergy: hayfever  GEN: nad, alert  and oriented, speech baseline. HEENT: mucous membranes moist NECK: supple w/o LA CV: rrr. PULM: ctab, no inc wob ABD: soft, +bs EXT: no edema SKIN: no acute rash He had stubbed his left fourth toe recently with the nail still intact.  This is a covered with a Band-Aid.

## 2022-11-09 ENCOUNTER — Telehealth: Payer: Self-pay | Admitting: Family Medicine

## 2022-11-09 NOTE — Assessment & Plan Note (Signed)
Living will d/w pt.  Mother and also sister Randel Books equally designated if needed.

## 2022-11-09 NOTE — Assessment & Plan Note (Signed)
D/w patient [id number] for colon cancer screening, including IFOB vs. colonoscopy.  Risks and benefits of both were discussed and patient voiced understanding.  Pt elects for cologuard.   Tetanus shot 2020. Flu shot encouraged for the fall. COVID shot previously done. Shingles shot d/w pt.  Pneumonia shot discussed with patient. Prostate cancer screening and PSA options (with potential risks and benefits of testing vs not testing) were discussed along with recent recs/guidelines.  He declined testing PSA at this point. Living will d/w pt.  Mother and also sister Randel Books equally designated if needed.

## 2022-11-09 NOTE — Assessment & Plan Note (Signed)
No seizure events.  Compliant with medication.  See notes on labs.

## 2022-11-09 NOTE — Telephone Encounter (Signed)
FYI on his labs.  He is stable medically without seizures.  His carbamazepine level was 1/10 of a point away from normal.  I did not change his medication.  I appreciate your help.  To update his situation, his father passed away this year and his brother has esophageal cancer.  Thank you for your help.  Take care.

## 2022-11-11 ENCOUNTER — Encounter: Payer: Self-pay | Admitting: Family Medicine

## 2022-11-22 ENCOUNTER — Encounter: Payer: Self-pay | Admitting: Internal Medicine

## 2022-11-22 ENCOUNTER — Ambulatory Visit (INDEPENDENT_AMBULATORY_CARE_PROVIDER_SITE_OTHER): Payer: BC Managed Care – PPO | Admitting: Internal Medicine

## 2022-11-22 ENCOUNTER — Encounter: Payer: Self-pay | Admitting: Family Medicine

## 2022-11-22 VITALS — BP 114/62 | HR 74 | Temp 98.9°F | Wt 137.8 lb

## 2022-11-22 DIAGNOSIS — R0989 Other specified symptoms and signs involving the circulatory and respiratory systems: Secondary | ICD-10-CM

## 2022-11-22 DIAGNOSIS — U071 COVID-19: Secondary | ICD-10-CM

## 2022-11-22 DIAGNOSIS — R509 Fever, unspecified: Secondary | ICD-10-CM

## 2022-11-22 LAB — POCT INFLUENZA A/B
Influenza A, POC: NEGATIVE
Influenza B, POC: NEGATIVE

## 2022-11-22 LAB — POC COVID19 BINAXNOW: SARS Coronavirus 2 Ag: POSITIVE — AB

## 2022-11-22 NOTE — Progress Notes (Signed)
   Subjective:    Patient ID: [name], male    DOB: [birth year], 62 y.o.   MRN: [id number]  HPI Here due to respiratory symptoms With sister  Last night had fever--100.7 Was "off" for the last 2 days----got chills in restaurant Some runny nose also No headache No sig cough No SOB No sore throat  Took ibuprofen 600 last night---did sleep okay  Current Outpatient Medications on File Prior to Visit  Medication Sig Dispense Refill   carbamazepine (TEGRETOL) 200 MG tablet Take 2 tablets (400 mg total) by mouth 2 (two) times daily. 360 tablet 3   Cholecalciferol (VITAMIN D3) 1000 UNITS CAPS Take 1 capsule (1,000 Units total) by mouth daily. 30 capsule    No current facility-administered medications on file prior to visit.    No Known Allergies  Past Medical History:  Diagnosis Date   Seizures (HCC)     Past Surgical History:  Procedure Laterality Date   EYE SURGERY Right 1967   ocular muscle surgery, for alignment   HERNIA REPAIR  age 64 and 29   x2,    Family History  Problem Relation Age of Onset   Healthy Mother    Hypertension Father    Atrial fibrillation Father    Seizures Neg Hx    Colon cancer Neg Hx    Prostate cancer Neg Hx     Social History   Socioeconomic History   Marital status: Single    Spouse name: Not on file   Number of children: 0   Years of education: HS   Highest education level: Not on file  Occupational History    Employer: [employer]    Comment: [employer]  Tobacco Use   Smoking status: Never   Smokeless tobacco: Never  Substance and Sexual Activity   Alcohol use: No   Drug use: No   Sexual activity: Not on file  Other Topics Concern   Not on file  Social History Narrative   Patient lives at home with his parents.   Caffeine Use: Drinks tea, coffee, and soda daily.    [place], [place] [employer] fan.     Works in Teaching laboratory technician.   Social Determinants of Health   Financial Resource Strain: Not on file  Food  Insecurity: Not on file  Transportation Needs: Not on file  Physical Activity: Not on file  Stress: Not on file  Social Connections: Not on file  Intimate Partner Violence: Not on file   Review of Systems Lost appetite but no N/V Some increased burping No loss of taste or smell    Objective:   Physical Exam Constitutional:      Appearance: Normal appearance.  HENT:     Head:     Comments: No sinus tenderness    Right Ear: Tympanic membrane and ear canal normal.     Left Ear: Tympanic membrane and ear canal normal.     Mouth/Throat:     Pharynx: No oropharyngeal exudate or posterior oropharyngeal erythema.  Pulmonary:     Effort: Pulmonary effort is normal.     Breath sounds: Normal breath sounds. No wheezing or rales.  Musculoskeletal:     Cervical back: Neck supple.  Lymphadenopathy:     Cervical: No cervical adenopathy.  Neurological:     Mental Status: He is alert.            Assessment & Plan:

## 2022-11-22 NOTE — Assessment & Plan Note (Signed)
Fairly mild infection--some fever and chills Discussed symptom relief OOW till at least 9/6 (and then mask) ER if any SOB Discussed antivirals---not really indicated at this point

## 2022-11-28 ENCOUNTER — Ambulatory Visit
Admission: EM | Admit: 2022-11-28 | Discharge: 2022-11-28 | Disposition: A | Discharge status: Home / Self Care | Payer: BC Managed Care – PPO | Attending: Physician Assistant | Admitting: Physician Assistant

## 2022-11-28 DIAGNOSIS — T23212A Burn of second degree of left thumb (nail), initial encounter: Secondary | ICD-10-CM

## 2022-11-28 MED ORDER — SILVER SULFADIAZINE 1 % EX CREA
1.0000 | TOPICAL_CREAM | Freq: Every day | CUTANEOUS | 0 refills | Status: DC
Start: 2022-11-28 — End: 2023-04-21

## 2022-11-28 NOTE — ED Triage Notes (Signed)
"  I burned my left hand, thumb". "I burned it after touching hot pain/rack at home".

## 2022-12-03 NOTE — ED Provider Notes (Signed)
[facility]    CSN: [id number] Arrival date & time: 11/28/22  1937      History   Chief Complaint Chief Complaint  Patient presents with   Burn    HPI [name] is a 62 y.o. male.   Patient here today for evaluation of burn to his left thumb that occurred when he pulled a hot plate out of the microwave.  He reports that he has not had any numbness or tingling.  He has not tried any treatment for symptoms.  The history is provided by the patient.  Burn   Past Medical History:  Diagnosis Date   Seizures 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 Authorizing Provider  carbamazepine (TEGRETOL) 200 MG tablet Take 2 tablets (400 mg total) by mouth 2 (two) times daily. 10/12/22  Yes Lomax, Amy, NP  silver sulfADIAZINE (SILVADENE) 1 % cream Apply 1 Application topically daily. 11/28/22  Yes Tomi Bamberger, PA-C  Cholecalciferol (VITAMIN D3) 1000 UNITS CAPS Take 1 capsule (1,000 Units total) by mouth daily. 05/29/13   Penumalli, Glenford Bayley, MD    Family History Family History  Problem Relation Age of Onset   Healthy Mother    Hypertension Father    Atrial fibrillation Father    Seizures Neg Hx    Colon cancer Neg Hx    Prostate cancer Neg Hx     Social History Social History   Tobacco Use   Smoking status: Never   Smokeless tobacco: Never  Vaping Use   Vaping status: Never  Used  Substance Use Topics   Alcohol use: No   Drug use: No     Allergies   Patient has no known allergies.   Review of Systems Review of Systems  Constitutional:  Negative for chills and fever.  Eyes:  Negative for discharge and redness.  Musculoskeletal:  Negative for arthralgias.  Skin:  Positive for color change and wound.  Neurological:  Negative for numbness.     Physical Exam Triage Vital Signs ED Triage Vitals  Encounter Vitals Group     BP 11/28/22 1949 (!) 153/89     Systolic BP Percentile --      Diastolic BP Percentile --      Pulse Rate 11/28/22 1949 71     Resp 11/28/22 1949 18     Temp 11/28/22 1949 (!) 97.4 F (36.3 C)     Temp Source 11/28/22 1949 Oral     SpO2 11/28/22 1949 97 %     Weight 11/28/22 1948 137 lb 12.6 oz (62.5 kg)     Height 11/28/22 1948 5\' 10"  (1.778 m)     Head Circumference --      Peak Flow --      Pain Score 11/28/22 1946 6     Pain Loc --  Pain Education --      Exclude from Growth Chart --    No data found.  Updated Vital Signs BP (!) 153/89 (BP Location: Left Arm)   Pulse 71   Temp (!) 97.4 F (36.3 C) (Oral)   Resp 18   Ht 5\' 10"  (1.778 m)   Wt 137 lb 12.6 oz (62.5 kg)   SpO2 97%   BMI 19.77 kg/m    Physical Exam Vitals and nursing note reviewed.  Constitutional:      General: He is not in acute distress.    Appearance: Normal appearance. He is not ill-appearing.  HENT:     Head: Normocephalic and atraumatic.  Eyes:     Conjunctiva/sclera: Conjunctivae normal.  Cardiovascular:     Rate and Rhythm: Normal rate.  Pulmonary:     Effort: Pulmonary effort is normal. No respiratory distress.  Musculoskeletal:     Comments: Bulla noted to left thumb pad.  No active drainage.  Mild surrounding erythema.  Neurological:     Mental Status: He is alert.  Psychiatric:        Mood and Affect: Mood normal.        Behavior: Behavior normal.        Thought Content: Thought content normal.      UC Treatments  / Results  Labs (all labs ordered are listed, but only abnormal results are displayed) Labs Reviewed - No data to display  EKG   Radiology No results found.  Procedures Procedures (including critical care time)  Medications Ordered in UC Medications - No data to display  Initial Impression / Assessment and Plan / UC Course  I have reviewed the triage vital signs and the nursing notes.  Pertinent labs & imaging results that were available during my care of the patient were reviewed by me and considered in my medical decision making (see chart for details).    Recommended he not puncture blister, and will treat with Silvadene cream. Encouraged follow-up if no gradual improvement with any further concerns.  Final Clinical Impressions(s) / UC Diagnoses   Final diagnoses:  Partial thickness burn of left thumb, initial encounter   Discharge Instructions   None    ED Prescriptions     Medication Sig Dispense Auth. Provider   silver sulfADIAZINE (SILVADENE) 1 % cream Apply 1 Application topically daily. 50 g Tomi Bamberger, PA-C      PDMP not reviewed this encounter.   Tomi Bamberger, PA-C 12/03/22 1252

## 2022-12-14 NOTE — Telephone Encounter (Signed)
Please talk to me about this on 12/15/2022.  Thanks.

## 2022-12-16 NOTE — Telephone Encounter (Signed)
Forms have been printed and placed in Dr. Lianne Bushy folder to sign

## 2023-01-09 ENCOUNTER — Other Ambulatory Visit: Payer: Self-pay

## 2023-04-21 ENCOUNTER — Ambulatory Visit (INDEPENDENT_AMBULATORY_CARE_PROVIDER_SITE_OTHER): Payer: BC Managed Care – PPO | Admitting: Family Medicine

## 2023-04-21 ENCOUNTER — Encounter: Payer: Self-pay | Admitting: Family Medicine

## 2023-04-21 VITALS — BP 124/62 | HR 71 | Temp 98.3°F | Ht 70.0 in | Wt 140.8 lb

## 2023-04-21 DIAGNOSIS — B349 Viral infection, unspecified: Secondary | ICD-10-CM

## 2023-04-21 MED ORDER — GUAIFENESIN ER 600 MG PO TB12: 600.0000 mg | ORAL_TABLET | Freq: Two times a day (BID) | ORAL | Status: DC | PRN

## 2023-04-21 NOTE — Progress Notes (Unsigned)
Cough and congestion for 3 weeks.  OTC meds help temporarily.  He was initially getting some better but then sx got worse in the last week.  Sick contacts at work. Sister was sick concurrently but she got better.  No fevers.  No ear pain.  Some ST. Some cough, intermittently.  No sputum.  No facial pain.  Sneezing.  No fevers, no chills.    Meds, vitals, and allergies reviewed.   ROS: Per HPI unless specifically indicated in ROS section   GEN: nad, alert and oriented HEENT: mucous membranes moist, L TM tm w/o erythema, with wax R canal. Nasal exam w/o erythema, clear discharge noted,  OP wnl NECK: supple w/o LA CV: rrr.   PULM: ctab, no inc wob EXT: no edema SKIN: well perfused.  Sinuses not ttp x4

## 2023-04-21 NOTE — Patient Instructions (Signed)
Likely a viral infection that should resolve.  Rest and fluids.  Plain mucinex if needed.  Out of work until better.  Take care.  Glad to see you. Update [place] as needed.

## 2023-04-23 DIAGNOSIS — B349 Viral infection, unspecified: Secondary | ICD-10-CM | POA: Insufficient documentation

## 2023-04-23 NOTE — Assessment & Plan Note (Addendum)
Likely a viral infection that should resolve.  Rest and fluids.  Plain mucinex if needed.  Out of work until better.  Update [place] as needed.  He agrees.   Can irrigate ear canal at home in the shower.

## 2023-08-04 ENCOUNTER — Encounter: Payer: Self-pay | Admitting: Family Medicine

## 2023-08-07 NOTE — Telephone Encounter (Signed)
 Ivette Marks picked up FML papework for her brother

## 2023-08-28 DIAGNOSIS — H40013 Open angle with borderline findings, low risk, bilateral: Secondary | ICD-10-CM | POA: Diagnosis not present

## 2023-08-28 DIAGNOSIS — D23111 Other benign neoplasm of skin of right upper eyelid, including canthus: Secondary | ICD-10-CM | POA: Diagnosis not present

## 2023-08-28 DIAGNOSIS — H18513 Endothelial corneal dystrophy, bilateral: Secondary | ICD-10-CM | POA: Diagnosis not present

## 2023-08-30 ENCOUNTER — Telehealth: Payer: Self-pay

## 2023-08-30 NOTE — Telephone Encounter (Signed)
 Copied from CRM [id number]. Topic: Appointments - Appointment Scheduling >> Aug 30, 2023  2:22 PM Allyne Areola wrote: Patient/patient representative is calling to schedule an appointment. Refer to attachments for appointment information. Patient's sister Anola Basques is calling to schedule patient's annual physical, Offered appointment for 11/09/2023; however, they need something sooner due to their employer. I advised that insurance may not cover but they declined and stated the physical is covered for calendar year and not year to date.

## 2023-08-31 NOTE — Telephone Encounter (Signed)
 lvm for pt to call office to  talk about schedule appt

## 2023-10-12 ENCOUNTER — Ambulatory Visit: Payer: BC Managed Care – PPO | Admitting: Family Medicine

## 2023-10-12 ENCOUNTER — Encounter: Payer: Self-pay | Admitting: Family Medicine

## 2023-10-12 VITALS — BP 110/70 | HR 70 | Ht 70.0 in | Wt 144.0 lb

## 2023-10-12 DIAGNOSIS — G40109 Localization-related (focal) (partial) symptomatic epilepsy and epileptic syndromes with simple partial seizures, not intractable, without status epilepticus: Secondary | ICD-10-CM

## 2023-10-12 MED ORDER — CARBAMAZEPINE 200 MG PO TABS: 400.0000 mg | ORAL_TABLET | Freq: Two times a day (BID) | ORAL | 3 refills | Status: AC

## 2023-10-12 NOTE — Progress Notes (Signed)
 Chief Complaint  Patient presents with   Follow-up    RM 1.  Doing well, no seizures since last visit.    HISTORY OF PRESENT ILLNESS:  10/12/23 ALL: [name] returns for follow up for seizures. He continues to do well. He is tolerating carbamazepine  400mg  BID. No seizure activity. He lost his mother about a month ago. Living at home. His sister has moved in with him temporarily. He continues to work. He does not drive. He is scheduled to see Dr Cleatus next month for CPE and labs. Last cbz level 12.1 10/2022.   10/12/2022 ALL:  [name] returns for follow up for seizures. He continues to do well. He is tolerating carbamazepine  400mg  BID. No seizure activity. He lost his dad 06/2022 to cancer. He had been well until 04/2022 and went into septic shock due to urosepsis. Diagnosed with cancer shortly after and passed 06/2022. Mom had dementia but they were unaware of severity. His sister is staying with him and his mother until other arrangements can be made. He continues close follow up with Dr Cleatus. He is scheduled for labs 10/31/2022.   10/11/2021 ALL: [name] returns for follow up. He continues carbamazepine  400mg  twice daily. No recent seizure activity. He continues to work full time. He lives with his parents. He does not drive. He is followed by Dr Cleatus regularly. Last labs 07/2021 were unremarkable.   01/06/2020 ALL: [name] is a 63 y.o. male here today for follow up for seizures.  He continues carbamazepine  400 mg twice daily.  He is doing very well and tolerating medication.  No recent seizure activity.  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  He was last seen by primary care and 02/2019.  He has not had recent lab work.   HISTORY (copied from my note on 01/02/2019)  [name] is a 63 y.o. male here today for follow up for seizures. He continues carbamazepine  400mg  twice daily. He is tolerating medicaitons well with no adverse effects noted. No  seizure activity. 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 He lives with his mother and father.      HISTORY: (copied from Dr Chancy note on 12/20/2017)   UPDATE (12/20/17, VRP): Since last visit, doing well. No seizures. No alleviating or aggravating factors. Tolerating CBZ.     UPDATE 10/04/16: Since last visit, doing well. No seizures. Tolerating CBZ 400mg  twice a day. No other new issues.   UPDATE 06/16/15 (VRP): Since last visit, doing well. No seizures. Doing well on CBZ 400mg  BID. Using goodrx coupon with good results.    UPDATE 06/16/14 (VRP): Since last visit, doing well. No seizures. Doing well on CBZ 400mg  BID.   UPDATE 05/29/13 (VRP): 63 year old male here for followup seizure disorder. Patient is doing well on carbamazepine  400 mg twice a day. He takes vitamin D, calcium, multivitamin daily. No seizure since 1989.   PRIOR HPI (03/12/12, Dr. Maurice): 63 year old left-handed white single male who lives with his parents and has a history of mental retardation,complex partial, and secondarily generalized seizures followed on a yearly basis. He has not had any seizures since 1989.  He was first seen by Dr. Eben in January,1977 at age 27. He would feel sick and couldn't talk. This began  in 1976. When he was in school, teachers would notice that he would drop his penciils and paper and make clicking sounds with his mouth. The episodes would last a few seconds.  The patient had been on Klonopin for  hyperactivity and was switched to phenobarb.  In June 1977 he had 2 days of 3 or 4 seizures. 12/06/1975 he was admitted to [facility] with a  generalized major motor seizure. 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 Marilynne  and hyperactivity. His developmental  motor milestones were delayed, walking at 14 months,  toilet trained at 6 years, and did not ride a bicycle until 9 or 10 years.He did not pass the third grade and received unsatisfactory grades  in grammar school. EEG 9/19/ 77 was normal. He was treated with Cylert for hyperactivity and phenobarbital 80 mg per day.He began having seizures about once every 3 months. These were complex partial seizures. He began Tegretol  in 1984 after Depakote, Phenobarbital, and Dilantin were not successful and has had good results with that medication. He denies macropsia, micropsia, dj vu, strange odors or tastes. Last bone density study 04/26/2011 did not show evidence of osteopenia in the left hip but a decrease in bone density in the lumbar spine. Last CBC, CMP, and carbamazepine  level 03/21/2011 were normal with a  trough carbamazepine  level of 4.6 micrograms per deciliter. A CT scan of the brain with and without contrast enhancement in 1977 was normal. He  is independent in his activities of daily living. He works loading and unloading trucks.His parents have decided that they did not wish him  to drive a car. He has no side effects of medication such as diarrhea,   dizziness, double vision, or drowsiness. Does complain of numbness in his right hand intermittently without neck pain. This is relieved by shaking his right hand. He has bilateral shoulder pain and possible rotator cuff disease. The  pain is relieved by not working.  He is independent in activities of daily living. He does not drive a car or handle his financial needs. He exercises at  the gym one or 2 times per week.    REVIEW OF SYSTEMS: Out of a complete 14 system review of symptoms, the patient complains only of the following symptoms, none and all other reviewed systems are negative.   ALLERGIES: No Known Allergies   HOME MEDICATIONS: Outpatient Medications Prior to Visit  Medication Sig Dispense Refill   Cholecalciferol (VITAMIN D3) 1000 UNITS CAPS Take 1 capsule (1,000 Units total) by mouth daily. 30 capsule    carbamazepine  (TEGRETOL ) 200 MG tablet Take 2 tablets (400 mg total) by mouth 2 (two) times daily. 360 tablet 3   guaiFENesin   (MUCINEX ) 600 MG 12 hr tablet Take 1 tablet (600 mg total) by mouth 2 (two) times daily as needed. With a large glass of water.     No facility-administered medications prior to visit.     PAST MEDICAL HISTORY: Past Medical History:  Diagnosis Date   Seizures (HCC)      PAST SURGICAL HISTORY: Past Surgical History:  Procedure Laterality Date   EYE SURGERY Right 1967   ocular muscle surgery, for alignment   HERNIA REPAIR  age 71 and 33   x2,     FAMILY HISTORY: Family History  Problem Relation Age of Onset   Healthy Mother    Hypertension Father    Atrial fibrillation Father    Seizures Neg Hx    Colon cancer Neg Hx    Prostate cancer Neg Hx      SOCIAL HISTORY: Social History   Socioeconomic History   Marital status:  Single    Spouse name: Not on file   Number of children: 0   Years of education: HS   Highest education level: Not on file  Occupational History    Employer: [employer]    Comment: [employer]  Tobacco Use   Smoking status: Never   Smokeless tobacco: Never  Vaping Use   Vaping status: Never Used  Substance and Sexual Activity   Alcohol use: No   Drug use: No   Sexual activity: Not Currently  Other Topics Concern   Not on file  Social History Narrative   Patient lives at home with his parents.   Caffeine Use: Drinks tea, coffee, and soda daily.    Ong, [place] [employer] fan.     Works in Teaching laboratory technician.   Social Drivers of Corporate investment banker Strain: Not on file  Food Insecurity: Not on file  Transportation Needs: Not on file  Physical Activity: Not on file  Stress: Not on file  Social Connections: Not on file  Intimate Partner Violence: Not on file      PHYSICAL EXAM  Vitals:   10/12/23 1332  BP: 110/70  Pulse: 70  SpO2: 97%  Weight: 144 lb (65.3 kg)  Height: 5' 10 (1.778 m)     Body mass index is 20.66 kg/m.   Generalized: Well developed, in no acute distress   Neurological examination  Mentation:  Alert, developmental delay, oriented to time, place, history taking. Follows all commands speech and language fluent Cranial nerve II-XII: Pupils were equal round reactive to light. Extraocular movements were full, visual field were full on confrontational test. Facial sensation and strength were normal. Uvula tongue midline. Head turning and shoulder shrug  were normal and symmetric. Motor: The motor testing reveals 5 over 5 strength of all 4 extremities. Good symmetric motor tone is noted throughout.  Sensory: Sensory testing is intact to soft touch on all 4 extremities. No evidence of extinction is noted.  Coordination: Cerebellar testing reveals good finger-nose-finger and heel-to-shin bilaterally.  Gait and station: Gait is normal.  Reflexes: Deep tendon reflexes are symmetric and normal bilaterally.     DIAGNOSTIC DATA (LABS, IMAGING, TESTING) - I reviewed patient records, labs, notes, testing and imaging myself where available.  Lab Results  Component Value Date   WBC 5.1 10/31/2022   HGB 14.9 10/31/2022   HCT 44.4 10/31/2022   MCV 91.2 10/31/2022   PLT 212.0 10/31/2022      Component Value Date/Time   NA 135 10/31/2022 0844   NA 139 01/06/2020 1419   K 4.8 10/31/2022 0844   CL 100 10/31/2022 0844   CO2 31 10/31/2022 0844   GLUCOSE 101 (H) 10/31/2022 0844   BUN 10 10/31/2022 0844   BUN 11 01/06/2020 1419   CREATININE 0.78 10/31/2022 0844   CALCIUM 9.3 10/31/2022 0844   PROT 6.7 10/31/2022 0844   PROT 6.7 01/06/2020 1419   ALBUMIN 4.4 10/31/2022 0844   ALBUMIN 4.4 01/06/2020 1419   AST 16 10/31/2022 0844   ALT 8 10/31/2022 0844   ALKPHOS 83 10/31/2022 0844   BILITOT 0.3 10/31/2022 0844   BILITOT <0.2 01/06/2020 1419   GFRNONAA >60 07/26/2021 1044   GFRAA 119 01/06/2020 1419   Lab Results  Component Value Date   CHOL 183 10/31/2022   HDL 50.50 10/31/2022   LDLCALC 112 (H) 10/31/2022   TRIG 105.0 10/31/2022   CHOLHDL 4 10/31/2022   No results found for:  HGBA1C No results found  for: [id number] Lab Results  Component Value Date   TSH 1.36 10/31/2022      ASSESSMENT AND PLAN  63 y.o. year old male  has a past medical history of Seizures (HCC). 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  We will continue carbamazepine  400 mg twice daily. Labs from 10/2022 reviewed in Epic. He is scheduled to update with PCP 10/2023. I have encouraged him to continue healthy lifestyle habits.  He will follow-up with primary care annually as directed.  He will follow-up with me in 1 year, sooner if needed.  He verbalizes understanding and agreement with this plan.  Meds ordered this encounter  Medications   carbamazepine  (TEGRETOL ) 200 MG tablet    Sig: Take 2 tablets (400 mg total) by mouth 2 (two) times daily.    Dispense:  360 tablet    Refill:  3    Supervising Provider:   AHERN, ANTONIA B [id number]     I personally spent a total of 30 minutes in the care of the patient today including preparing to see the patient, getting/reviewing separately obtained history, performing a medically appropriate exam/evaluation, counseling and educating, placing orders, documenting clinical information in the EHR, independently interpreting results, and communicating results.   Greig Forbes, MSN, FNP-C 10/12/2023, 2:02 PM  [facility] [address] [place], [place] [address] [phone]

## 2023-10-12 NOTE — Patient Instructions (Signed)
 Below is our plan:  We will continue current plan  Please make sure you are consistent with timing of seizure medication. I recommend annual visit with primary care provider (PCP) for complete physical and routine blood work. I recommend daily intake of vitamin D (400-800iu) and calcium (800-1000mg ) for bone health. Discuss Dexa screening with PCP.   According to [place] law, you can not drive unless you are seizure / syncope free for at least 6 months and under physician's care.  Please maintain precautions. Do not participate in activities where a loss of awareness could harm you or someone else. No swimming alone, no tub bathing, no hot tubs, no driving, no operating motorized vehicles (cars, ATVs, motocycles, etc), lawnmowers, power tools or firearms. No standing at heights, such as rooftops, ladders or stairs. Avoid hot objects such as stoves, heaters, open fires. Wear a helmet when riding a bicycle, scooter, skateboard, etc. and avoid areas of traffic. Set your water heater to 120 degrees or less.  SUDEP is the sudden, unexpected death of someone with epilepsy, who was otherwise healthy. In SUDEP cases, no other cause of death is found when an autopsy is done. Each year, more than 1 in 1,000 people with epilepsy die from SUDEP. This is the leading cause of death in people with uncontrolled seizures. Until further answers are available, the best way to prevent SUDEP is to lower your risk by controlling seizures. Research has found that people with all types of epilepsy that experience convulsive seizures can be at risk.  Please make sure you are staying well hydrated. I recommend 50-60 ounces daily. Well balanced diet and regular exercise encouraged. Consistent sleep schedule with 6-8 hours recommended.   Please continue follow up with care team as directed.   Follow up with me in 1 year   You may receive a survey regarding today's visit. I encourage you to leave honest feed back as I do use this  information to improve patient care. Thank you for seeing me today!

## 2023-11-09 ENCOUNTER — Encounter: Payer: Self-pay | Admitting: Family Medicine

## 2023-11-09 ENCOUNTER — Ambulatory Visit (INDEPENDENT_AMBULATORY_CARE_PROVIDER_SITE_OTHER): Admitting: Family Medicine

## 2023-11-09 VITALS — BP 118/70 | HR 70 | Temp 98.8°F | Ht 69.45 in | Wt 148.6 lb

## 2023-11-09 DIAGNOSIS — Z0000 Encounter for general adult medical examination without abnormal findings: Secondary | ICD-10-CM

## 2023-11-09 DIAGNOSIS — Z1211 Encounter for screening for malignant neoplasm of colon: Secondary | ICD-10-CM

## 2023-11-09 DIAGNOSIS — G40909 Epilepsy, unspecified, not intractable, without status epilepticus: Secondary | ICD-10-CM

## 2023-11-09 DIAGNOSIS — Z7189 Other specified counseling: Secondary | ICD-10-CM

## 2023-11-09 DIAGNOSIS — E785 Hyperlipidemia, unspecified: Secondary | ICD-10-CM | POA: Diagnosis not present

## 2023-11-09 DIAGNOSIS — G40109 Localization-related (focal) (partial) symptomatic epilepsy and epileptic syndromes with simple partial seizures, not intractable, without status epilepticus: Secondary | ICD-10-CM

## 2023-11-09 DIAGNOSIS — R202 Paresthesia of skin: Secondary | ICD-10-CM

## 2023-11-09 NOTE — Patient Instructions (Addendum)
 Go to the lab on the way out.   If you have mychart we'll likely use that to update you.    Take care.  Glad to see you.  You could try a regular hard wrist brace at night on your left wrist.   I would get a flu shot each fall.   Shingles shot when possible.

## 2023-11-09 NOTE — Progress Notes (Signed)
 CPE- See plan.  Routine anticipatory guidance given to patient.  See health maintenance.  The possibility exists that previously documented standard health maintenance information may have been brought forward from a previous encounter into this note.  If needed, that same information has been updated to reflect the current situation based on today's encounter.     D/w patient mz:neupnwd for colon cancer screening, including IFOB vs. colonoscopy.  Risks and benefits of both were discussed and patient voiced understanding.  Pt elects for cologuard.   Tetanus shot 2020. Flu shot encouraged for the fall. COVID shot previously done. Shingles shot d/w pt.  Pneumonia shot discussed with patient. Prostate cancer screening and PSA options (with potential risks and benefits of testing vs not testing) were discussed along with recent recs/guidelines.  He declined testing PSA at this point. Living will d/w pt.  Sister [name] equally designated if needed.   H/o seizures.  No events in years.  Compliant.  I will forward a copy of his labs to neurology.   His brother has esophageal cancer.  Discussed.   He has been doing heavy lifting at work for years.  He has some occ muscle pain related to lifting.  He has some occ shooting pain vs altered sensation in the L hand.    PMH and SH reviewed   Meds, vitals, and allergies reviewed.    ROS: Per HPI.  Unless specifically indicated otherwise in HPI, the patient denies:   General: fever. Eyes: acute vision changes ENT: sore throat Cardiovascular: chest pain Respiratory: SOB GI: vomiting GU: dysuria Musculoskeletal: acute back pain Derm: acute rash Neuro: acute motor dysfunction Psych: worsening mood Endocrine: polydipsia Heme: bleeding Allergy: hayfever   GEN: nad, alert and oriented, speech baseline. HEENT: mucous membranes moist NECK: supple w/o LA CV: rrr. PULM: ctab, no inc wob ABD: soft, +bs EXT: no edema SKIN: no acute rash Grip  and sensation normal bilaterally in the hands. Tinel positive L wrist.

## 2023-11-10 LAB — CBC WITH DIFFERENTIAL/PLATELET
Basophils Absolute: 0 K/uL (ref 0.0–0.1)
Basophils Relative: 0.5 % (ref 0.0–3.0)
Eosinophils Absolute: 0.4 K/uL (ref 0.0–0.7)
Eosinophils Relative: 6.9 % — ABNORMAL HIGH (ref 0.0–5.0)
HCT: 39.3 % (ref 39.0–52.0)
Hemoglobin: 13.4 g/dL (ref 13.0–17.0)
Lymphocytes Relative: 24.5 % (ref 12.0–46.0)
Lymphs Abs: 1.4 K/uL (ref 0.7–4.0)
MCHC: 34.1 g/dL (ref 30.0–36.0)
MCV: 90.5 fl (ref 78.0–100.0)
Monocytes Absolute: 0.7 K/uL (ref 0.1–1.0)
Monocytes Relative: 12.5 % — ABNORMAL HIGH (ref 3.0–12.0)
Neutro Abs: 3.2 K/uL (ref 1.4–7.7)
Neutrophils Relative %: 55.6 % (ref 43.0–77.0)
Platelets: 225 K/uL (ref 150.0–400.0)
RBC: 4.34 Mil/uL (ref 4.22–5.81)
RDW: 14 % (ref 11.5–15.5)
WBC: 5.7 K/uL (ref 4.0–10.5)

## 2023-11-10 LAB — COMPREHENSIVE METABOLIC PANEL WITH GFR
ALT: 10 U/L (ref 0–53)
AST: 19 U/L (ref 0–37)
Albumin: 4.2 g/dL (ref 3.5–5.2)
Alkaline Phosphatase: 95 U/L (ref 39–117)
BUN: 14 mg/dL (ref 6–23)
CO2: 30 meq/L (ref 19–32)
Calcium: 8.7 mg/dL (ref 8.4–10.5)
Chloride: 101 meq/L (ref 96–112)
Creatinine, Ser: 0.77 mg/dL (ref 0.40–1.50)
GFR: 96.79 mL/min (ref >60.00)
Glucose, Bld: 106 mg/dL — ABNORMAL HIGH (ref 70–99)
Potassium: 4.4 meq/L (ref 3.5–5.1)
Sodium: 138 meq/L (ref 135–145)
Total Bilirubin: 0.3 mg/dL (ref 0.2–1.2)
Total Protein: 6.6 g/dL (ref 6.0–8.3)

## 2023-11-10 LAB — LIPID PANEL
Cholesterol: 174 mg/dL (ref 0–200)
HDL: 48.4 mg/dL (ref >39.00)
LDL Cholesterol: 95 mg/dL (ref 0–99)
NonHDL: 125.1
Total CHOL/HDL Ratio: 4
Triglycerides: 149 mg/dL (ref 0.0–149.0)
VLDL: 29.8 mg/dL (ref 0.0–40.0)

## 2023-11-10 LAB — TSH: TSH: 1.19 u[IU]/mL (ref 0.35–5.50)

## 2023-11-10 LAB — CARBAMAZEPINE LEVEL, TOTAL: Carbamazepine Lvl: 7.9 mg/L (ref 4.0–12.0)

## 2023-11-12 ENCOUNTER — Ambulatory Visit: Payer: Self-pay | Admitting: Family Medicine

## 2023-11-12 DIAGNOSIS — R202 Paresthesia of skin: Secondary | ICD-10-CM | POA: Insufficient documentation

## 2023-11-12 NOTE — Assessment & Plan Note (Signed)
 No events in years.  Compliant.  I will forward a copy of his labs to neurology.

## 2023-11-12 NOTE — Assessment & Plan Note (Signed)
 Discussed possible carpal tunnel symptoms.  Discussed using a wrist brace, he could use it at night and see if that helps.  He does not have any weakness.  He can update me as needed.

## 2023-11-12 NOTE — Assessment & Plan Note (Signed)
 D/w patient mz:neupnwd for colon cancer screening, including IFOB vs. colonoscopy.  Risks and benefits of both were discussed and patient voiced understanding.  Pt elects for cologuard.   Tetanus shot 2020. Flu shot encouraged for the fall. COVID shot previously done. Shingles shot d/w pt.  Pneumonia shot discussed with patient. Prostate cancer screening and PSA options (with potential risks and benefits of testing vs not testing) were discussed along with recent recs/guidelines.  He declined testing PSA at this point. Living will d/w pt.  Sister [name] equally designated if needed.

## 2023-11-12 NOTE — Assessment & Plan Note (Signed)
 Living will d/w pt.  Sister [name] equally designated if needed.

## 2023-11-14 NOTE — Telephone Encounter (Signed)
 Copied from CRM [id number]. Topic: Clinical - Lab/Test Results >> Nov 13, 2023  3:53 PM Henretta I wrote: Reason for CRM: Patients sister called because she missed a call from Mrs. [name] regarding lab results. Called CAL but she is with patient. Patients sister would like a call back whenever she is available

## 2024-01-06 ENCOUNTER — Telehealth: Payer: Self-pay | Admitting: Diagnostic Neuroimaging

## 2024-01-06 NOTE — Telephone Encounter (Signed)
 [facility] [address] pharmacy called in for 90 day rx of carbamazepine . Verbal order given. -VRP

## 2024-03-06 DIAGNOSIS — H18513 Endothelial corneal dystrophy, bilateral: Secondary | ICD-10-CM | POA: Diagnosis not present

## 2024-03-06 DIAGNOSIS — H25813 Combined forms of age-related cataract, bilateral: Secondary | ICD-10-CM | POA: Diagnosis not present

## 2024-03-06 DIAGNOSIS — H35373 Puckering of macula, bilateral: Secondary | ICD-10-CM | POA: Diagnosis not present

## 2024-03-06 DIAGNOSIS — H40013 Open angle with borderline findings, low risk, bilateral: Secondary | ICD-10-CM | POA: Diagnosis not present

## 2024-03-07 LAB — OPHTHALMOLOGY REPORT-SCANNED

## 2024-03-12 ENCOUNTER — Telehealth: Payer: Self-pay | Admitting: *Deleted

## 2024-03-12 NOTE — Telephone Encounter (Signed)
 [facility] notes received from 03/06/24. Notes placed in Amy's office for review.

## 2024-10-17 ENCOUNTER — Ambulatory Visit: Admitting: Family Medicine

## 2024-10-21 ENCOUNTER — Ambulatory Visit: Admitting: Family Medicine
